# Patient Record
Sex: Male | Born: 1965
Health system: Southern US, Community
[De-identification: ages and names within clinical notes are randomized; demographics above are authoritative.]

---

## 2006-01-04 ENCOUNTER — Encounter: Admission: RE | Admit: 2006-01-04 | Discharge: 2006-01-04 | Payer: Self-pay | Admitting: Internal Medicine

## 2006-01-04 ENCOUNTER — Encounter (INDEPENDENT_AMBULATORY_CARE_PROVIDER_SITE_OTHER): Payer: Self-pay | Admitting: *Deleted

## 2006-01-04 ENCOUNTER — Ambulatory Visit: Payer: Self-pay | Admitting: Internal Medicine

## 2006-01-04 LAB — CONVERTED CEMR LAB
CD4 Count: 300 microliters
CD4 T Cell Abs: 300
HIV 1 RNA Quant: 47000 copies/mL

## 2006-01-24 ENCOUNTER — Ambulatory Visit: Payer: Self-pay | Admitting: Internal Medicine

## 2006-02-02 DIAGNOSIS — B20 Human immunodeficiency virus [HIV] disease: Secondary | ICD-10-CM

## 2006-02-02 DIAGNOSIS — B370 Candidal stomatitis: Secondary | ICD-10-CM | POA: Insufficient documentation

## 2006-02-23 ENCOUNTER — Ambulatory Visit: Payer: Self-pay | Admitting: Internal Medicine

## 2006-02-23 ENCOUNTER — Encounter: Admission: RE | Admit: 2006-02-23 | Discharge: 2006-02-23 | Payer: Self-pay | Admitting: Internal Medicine

## 2006-02-23 ENCOUNTER — Encounter (INDEPENDENT_AMBULATORY_CARE_PROVIDER_SITE_OTHER): Payer: Self-pay | Admitting: *Deleted

## 2006-02-23 LAB — CONVERTED CEMR LAB
CD4 Count: 550 microliters
HIV 1 RNA Quant: 864 copies/mL

## 2006-03-09 ENCOUNTER — Ambulatory Visit: Payer: Self-pay | Admitting: Internal Medicine

## 2006-04-11 ENCOUNTER — Ambulatory Visit: Payer: Self-pay | Admitting: Internal Medicine

## 2006-05-04 DIAGNOSIS — B009 Herpesviral infection, unspecified: Secondary | ICD-10-CM | POA: Insufficient documentation

## 2006-05-04 DIAGNOSIS — F329 Major depressive disorder, single episode, unspecified: Secondary | ICD-10-CM

## 2006-05-14 ENCOUNTER — Encounter (INDEPENDENT_AMBULATORY_CARE_PROVIDER_SITE_OTHER): Payer: Self-pay | Admitting: *Deleted

## 2006-05-14 LAB — CONVERTED CEMR LAB

## 2006-05-16 ENCOUNTER — Encounter: Payer: Self-pay | Admitting: Internal Medicine

## 2006-05-27 ENCOUNTER — Encounter (INDEPENDENT_AMBULATORY_CARE_PROVIDER_SITE_OTHER): Payer: Self-pay | Admitting: *Deleted

## 2006-06-04 ENCOUNTER — Ambulatory Visit: Payer: Self-pay | Admitting: Internal Medicine

## 2006-06-04 ENCOUNTER — Encounter: Admission: RE | Admit: 2006-06-04 | Discharge: 2006-06-04 | Payer: Self-pay | Admitting: Family Medicine

## 2006-06-04 LAB — CONVERTED CEMR LAB
ALT: 11 units/L (ref 0–53)
AST: 10 units/L (ref 0–37)
Albumin: 4 g/dL (ref 3.5–5.2)
Alkaline Phosphatase: 94 units/L (ref 39–117)
BUN: 19 mg/dL (ref 6–23)
Basophils Absolute: 0.1 10*3/uL (ref 0.0–0.1)
Basophils Relative: 2 % — ABNORMAL HIGH (ref 0–1)
CD4 Count: 630 microliters
CO2: 23 meq/L (ref 19–32)
Calcium: 9 mg/dL (ref 8.4–10.5)
Chloride: 106 meq/L (ref 96–112)
Creatinine, Ser: 0.91 mg/dL (ref 0.40–1.50)
Eosinophils Absolute: 0.3 10*3/uL (ref 0.0–0.7)
Eosinophils Relative: 4 % (ref 0–5)
Glucose, Bld: 89 mg/dL (ref 70–99)
HCT: 46 % (ref 39.0–52.0)
HIV 1 RNA Quant: <50 copies/mL (ref <50)
HIV-1 RNA Quant, Log: <1.7 (ref <1.70)
Hemoglobin: 15.4 g/dL (ref 13.0–17.0)
Lymphocytes Relative: 30 % (ref 12–46)
Lymphs Abs: 2.3 10*3/uL (ref 0.7–3.3)
MCHC: 33.5 g/dL (ref 30.0–36.0)
MCV: 92.9 fL (ref 78.0–100.0)
Monocytes Absolute: 0.6 10*3/uL (ref 0.2–0.7)
Monocytes Relative: 8 % (ref 3–11)
Neutro Abs: 4.3 10*3/uL (ref 1.7–7.7)
Neutrophils Relative %: 57 % (ref 43–77)
Platelets: 298 10*3/uL (ref 150–400)
Potassium: 4.3 meq/L (ref 3.5–5.3)
RBC: 4.95 M/uL (ref 4.22–5.81)
RDW: 14.1 % — ABNORMAL HIGH (ref 11.5–14.0)
Sodium: 140 meq/L (ref 135–145)
Total Bilirubin: 0.2 mg/dL — ABNORMAL LOW (ref 0.3–1.2)
Total Protein: 7.6 g/dL (ref 6.0–8.3)
WBC: 7.5 10*3/uL (ref 4.0–10.5)

## 2006-06-19 ENCOUNTER — Ambulatory Visit: Payer: Self-pay | Admitting: Internal Medicine

## 2006-06-19 DIAGNOSIS — M25569 Pain in unspecified knee: Secondary | ICD-10-CM

## 2006-07-05 ENCOUNTER — Telehealth: Payer: Self-pay | Admitting: Internal Medicine

## 2006-08-15 ENCOUNTER — Ambulatory Visit: Payer: Self-pay | Admitting: Internal Medicine

## 2006-08-15 ENCOUNTER — Encounter: Admission: RE | Admit: 2006-08-15 | Discharge: 2006-08-15 | Payer: Self-pay | Admitting: Internal Medicine

## 2006-08-15 LAB — CONVERTED CEMR LAB
ALT: 13 units/L (ref 0–53)
AST: 13 units/L (ref 0–37)
Albumin: 4.3 g/dL (ref 3.5–5.2)
Alkaline Phosphatase: 106 units/L (ref 39–117)
BUN: 20 mg/dL (ref 6–23)
Basophils Absolute: 0.1 10*3/uL (ref 0.0–0.1)
Basophils Relative: 1 % (ref 0–1)
CD4 Count: 830 microliters
CO2: 23 meq/L (ref 19–32)
Calcium: 8.7 mg/dL (ref 8.4–10.5)
Chloride: 105 meq/L (ref 96–112)
Creatinine, Ser: 1.06 mg/dL (ref 0.40–1.50)
Eosinophils Absolute: 0.3 10*3/uL (ref 0.0–0.7)
Eosinophils Relative: 3 % (ref 0–5)
Glucose, Bld: 86 mg/dL (ref 70–99)
HCT: 46.7 % (ref 39.0–52.0)
HIV 1 RNA Quant: 303 copies/mL — ABNORMAL HIGH (ref <50)
HIV-1 RNA Quant, Log: 2.48 — ABNORMAL HIGH (ref <1.70)
Hemoglobin: 15.5 g/dL (ref 13.0–17.0)
Lymphocytes Relative: 33 % (ref 12–46)
Lymphs Abs: 3 10*3/uL (ref 0.7–3.3)
MCHC: 33.2 g/dL (ref 30.0–36.0)
MCV: 91.4 fL (ref 78.0–100.0)
Monocytes Absolute: 0.8 10*3/uL — ABNORMAL HIGH (ref 0.2–0.7)
Monocytes Relative: 9 % (ref 3–11)
Neutro Abs: 4.9 10*3/uL (ref 1.7–7.7)
Neutrophils Relative %: 54 % (ref 43–77)
Platelets: 310 10*3/uL (ref 150–400)
Potassium: 4.7 meq/L (ref 3.5–5.3)
RBC: 5.11 M/uL (ref 4.22–5.81)
RDW: 14 % (ref 11.5–14.0)
Sodium: 138 meq/L (ref 135–145)
Total Bilirubin: 0.2 mg/dL — ABNORMAL LOW (ref 0.3–1.2)
Total Protein: 7.7 g/dL (ref 6.0–8.3)
WBC: 9.1 10*3/uL (ref 4.0–10.5)

## 2006-08-29 ENCOUNTER — Ambulatory Visit: Payer: Self-pay | Admitting: Internal Medicine

## 2006-09-06 ENCOUNTER — Encounter: Payer: Self-pay | Admitting: Internal Medicine

## 2006-10-04 ENCOUNTER — Telehealth: Payer: Self-pay | Admitting: Internal Medicine

## 2006-10-12 ENCOUNTER — Ambulatory Visit: Payer: Self-pay | Admitting: Internal Medicine

## 2006-10-12 ENCOUNTER — Encounter: Admission: RE | Admit: 2006-10-12 | Discharge: 2006-10-12 | Payer: Self-pay | Admitting: Internal Medicine

## 2006-10-12 LAB — CONVERTED CEMR LAB
ALT: 13 units/L (ref 0–53)
AST: 13 units/L (ref 0–37)
Albumin: 3.8 g/dL (ref 3.5–5.2)
Alkaline Phosphatase: 105 units/L (ref 39–117)
BUN: 14 mg/dL (ref 6–23)
Basophils Absolute: 0.1 10*3/uL (ref 0.0–0.1)
Basophils Relative: 1 % (ref 0–1)
CO2: 26 meq/L (ref 19–32)
Calcium: 9.5 mg/dL (ref 8.4–10.5)
Chloride: 105 meq/L (ref 96–112)
Creatinine, Ser: 1.04 mg/dL (ref 0.40–1.50)
Eosinophils Absolute: 0.3 10*3/uL (ref 0.0–0.7)
Eosinophils Relative: 3 % (ref 0–5)
Glucose, Bld: 99 mg/dL (ref 70–99)
HCT: 49.7 % (ref 39.0–52.0)
HIV 1 RNA Quant: 131 copies/mL — ABNORMAL HIGH (ref <50)
HIV-1 RNA Quant, Log: 2.12 — ABNORMAL HIGH (ref <1.70)
Hemoglobin: 16.1 g/dL (ref 13.0–17.0)
Lymphocytes Relative: 25 % (ref 12–46)
Lymphs Abs: 2 10*3/uL (ref 0.7–3.3)
MCHC: 32.4 g/dL (ref 30.0–36.0)
MCV: 95.2 fL (ref 78.0–100.0)
Monocytes Absolute: 0.6 10*3/uL (ref 0.2–0.7)
Monocytes Relative: 7 % (ref 3–11)
Neutro Abs: 5.1 10*3/uL (ref 1.7–7.7)
Neutrophils Relative %: 63 % (ref 43–77)
Platelets: 313 10*3/uL (ref 150–400)
Potassium: 4.5 meq/L (ref 3.5–5.3)
RBC: 5.22 M/uL (ref 4.22–5.81)
RDW: 14 % (ref 11.5–14.0)
Sodium: 142 meq/L (ref 135–145)
Total Bilirubin: 0.3 mg/dL (ref 0.3–1.2)
Total Protein: 7.5 g/dL (ref 6.0–8.3)
WBC: 8.1 10*3/uL (ref 4.0–10.5)

## 2006-10-26 ENCOUNTER — Ambulatory Visit: Payer: Self-pay | Admitting: Internal Medicine

## 2007-01-01 ENCOUNTER — Telehealth: Payer: Self-pay | Admitting: Internal Medicine

## 2007-01-28 ENCOUNTER — Encounter (INDEPENDENT_AMBULATORY_CARE_PROVIDER_SITE_OTHER): Payer: Self-pay | Admitting: *Deleted

## 2007-01-28 ENCOUNTER — Telehealth: Payer: Self-pay | Admitting: Internal Medicine

## 2007-01-28 ENCOUNTER — Ambulatory Visit: Payer: Self-pay | Admitting: Internal Medicine

## 2007-01-28 ENCOUNTER — Encounter: Admission: RE | Admit: 2007-01-28 | Discharge: 2007-01-28 | Payer: Self-pay | Admitting: Internal Medicine

## 2007-01-28 LAB — CONVERTED CEMR LAB
ALT: 15 units/L (ref 0–53)
AST: 15 units/L (ref 0–37)
Albumin: 4 g/dL (ref 3.5–5.2)
Alkaline Phosphatase: 106 units/L (ref 39–117)
BUN: 19 mg/dL (ref 6–23)
Basophils Absolute: 0.1 10*3/uL (ref 0.0–0.1)
Basophils Relative: 1 % (ref 0–1)
CO2: 21 meq/L (ref 19–32)
Calcium: 9.2 mg/dL (ref 8.4–10.5)
Chloride: 109 meq/L (ref 96–112)
Creatinine, Ser: 1.02 mg/dL (ref 0.40–1.50)
Eosinophils Absolute: 0.3 10*3/uL (ref 0.2–0.7)
Eosinophils Relative: 3 % (ref 0–5)
Glucose, Bld: 93 mg/dL (ref 70–99)
HCT: 49.1 % (ref 39.0–52.0)
HIV 1 RNA Quant: <50 copies/mL (ref <50)
HIV-1 RNA Quant, Log: <1.7 (ref <1.70)
Hemoglobin: 15.8 g/dL (ref 13.0–17.0)
Lymphocytes Relative: 33 % (ref 12–46)
Lymphs Abs: 2.6 10*3/uL (ref 0.7–4.0)
MCHC: 32.2 g/dL (ref 30.0–36.0)
MCV: 92.3 fL (ref 78.0–100.0)
Monocytes Absolute: 0.8 10*3/uL (ref 0.1–1.0)
Monocytes Relative: 11 % (ref 3–12)
Neutro Abs: 4.1 10*3/uL (ref 1.7–7.7)
Neutrophils Relative %: 52 % (ref 43–77)
Platelets: 318 10*3/uL (ref 150–400)
Potassium: 5.2 meq/L (ref 3.5–5.3)
RBC: 5.32 M/uL (ref 4.22–5.81)
RDW: 14.3 % (ref 11.5–15.5)
Sodium: 138 meq/L (ref 135–145)
Total Bilirubin: 0.2 mg/dL — ABNORMAL LOW (ref 0.3–1.2)
Total Protein: 7.4 g/dL (ref 6.0–8.3)
WBC: 7.9 10*3/uL (ref 4.0–10.5)

## 2007-02-13 ENCOUNTER — Ambulatory Visit: Payer: Self-pay | Admitting: Internal Medicine

## 2007-02-13 DIAGNOSIS — L0233 Carbuncle of buttock: Secondary | ICD-10-CM

## 2007-02-21 ENCOUNTER — Encounter: Payer: Self-pay | Admitting: Internal Medicine

## 2007-03-19 ENCOUNTER — Encounter: Payer: Self-pay | Admitting: Internal Medicine

## 2007-03-20 ENCOUNTER — Encounter (INDEPENDENT_AMBULATORY_CARE_PROVIDER_SITE_OTHER): Payer: Self-pay | Admitting: *Deleted

## 2007-04-01 ENCOUNTER — Telehealth (INDEPENDENT_AMBULATORY_CARE_PROVIDER_SITE_OTHER): Payer: Self-pay | Admitting: *Deleted

## 2007-04-11 ENCOUNTER — Telehealth: Payer: Self-pay | Admitting: Internal Medicine

## 2007-05-13 ENCOUNTER — Encounter: Admission: RE | Admit: 2007-05-13 | Discharge: 2007-05-13 | Payer: Self-pay | Admitting: Internal Medicine

## 2007-05-13 ENCOUNTER — Ambulatory Visit: Payer: Self-pay | Admitting: Internal Medicine

## 2007-05-13 LAB — CONVERTED CEMR LAB
ALT: 17 units/L (ref 0–53)
AST: 14 units/L (ref 0–37)
Albumin: 4.5 g/dL (ref 3.5–5.2)
Alkaline Phosphatase: 91 units/L (ref 39–117)
BUN: 21 mg/dL (ref 6–23)
Basophils Absolute: 0.1 10*3/uL (ref 0.0–0.1)
Basophils Relative: 2 % — ABNORMAL HIGH (ref 0–1)
CO2: 22 meq/L (ref 19–32)
Calcium: 9.8 mg/dL (ref 8.4–10.5)
Chloride: 105 meq/L (ref 96–112)
Creatinine, Ser: 0.92 mg/dL (ref 0.40–1.50)
Eosinophils Absolute: 0.2 10*3/uL (ref 0.0–0.7)
Eosinophils Relative: 3 % (ref 0–5)
Glucose, Bld: 103 mg/dL — ABNORMAL HIGH (ref 70–99)
HCT: 50.7 % (ref 39.0–52.0)
HIV 1 RNA Quant: 148 copies/mL — ABNORMAL HIGH (ref <50)
HIV-1 RNA Quant, Log: 2.17 — ABNORMAL HIGH (ref <1.70)
Hemoglobin: 16.3 g/dL (ref 13.0–17.0)
Lymphocytes Relative: 37 % (ref 12–46)
Lymphs Abs: 2.7 10*3/uL (ref 0.7–4.0)
MCHC: 32.1 g/dL (ref 30.0–36.0)
MCV: 92.7 fL (ref 78.0–100.0)
Monocytes Absolute: 0.7 10*3/uL (ref 0.1–1.0)
Monocytes Relative: 9 % (ref 3–12)
Neutro Abs: 3.7 10*3/uL (ref 1.7–7.7)
Neutrophils Relative %: 50 % (ref 43–77)
Platelets: 309 10*3/uL (ref 150–400)
Potassium: 5.1 meq/L (ref 3.5–5.3)
RBC: 5.47 M/uL (ref 4.22–5.81)
RDW: 14 % (ref 11.5–15.5)
Sodium: 139 meq/L (ref 135–145)
Total Bilirubin: 0.2 mg/dL — ABNORMAL LOW (ref 0.3–1.2)
Total Protein: 8.3 g/dL (ref 6.0–8.3)
WBC: 7.3 10*3/uL (ref 4.0–10.5)

## 2007-05-31 ENCOUNTER — Emergency Department (HOSPITAL_COMMUNITY): Admission: EM | Admit: 2007-05-31 | Discharge: 2007-05-31 | Payer: Self-pay | Admitting: Emergency Medicine

## 2007-06-05 ENCOUNTER — Ambulatory Visit: Payer: Self-pay | Admitting: Internal Medicine

## 2007-06-05 DIAGNOSIS — R030 Elevated blood-pressure reading, without diagnosis of hypertension: Secondary | ICD-10-CM | POA: Insufficient documentation

## 2007-06-05 DIAGNOSIS — G609 Hereditary and idiopathic neuropathy, unspecified: Secondary | ICD-10-CM | POA: Insufficient documentation

## 2007-07-02 ENCOUNTER — Telehealth: Payer: Self-pay | Admitting: Internal Medicine

## 2007-08-07 ENCOUNTER — Telehealth: Payer: Self-pay | Admitting: Internal Medicine

## 2007-09-02 ENCOUNTER — Ambulatory Visit: Payer: Self-pay | Admitting: Internal Medicine

## 2007-09-02 ENCOUNTER — Encounter: Admission: RE | Admit: 2007-09-02 | Discharge: 2007-09-02 | Payer: Self-pay | Admitting: Internal Medicine

## 2007-09-02 LAB — CONVERTED CEMR LAB
ALT: 20 units/L (ref 0–53)
AST: 15 units/L (ref 0–37)
Albumin: 4.2 g/dL (ref 3.5–5.2)
Alkaline Phosphatase: 87 units/L (ref 39–117)
BUN: 15 mg/dL (ref 6–23)
Basophils Absolute: 0.1 10*3/uL (ref 0.0–0.1)
Basophils Relative: 2 % — ABNORMAL HIGH (ref 0–1)
CO2: 22 meq/L (ref 19–32)
Calcium: 9.4 mg/dL (ref 8.4–10.5)
Chloride: 108 meq/L (ref 96–112)
Creatinine, Ser: 0.67 mg/dL (ref 0.40–1.50)
Eosinophils Absolute: 0.3 10*3/uL (ref 0.0–0.7)
Eosinophils Relative: 4 % (ref 0–5)
Glucose, Bld: 100 mg/dL — ABNORMAL HIGH (ref 70–99)
HCT: 48.8 % (ref 39.0–52.0)
HIV 1 RNA Quant: 336 copies/mL — ABNORMAL HIGH (ref <50)
HIV-1 RNA Quant, Log: 2.53 — ABNORMAL HIGH (ref <1.70)
Hemoglobin: 15.7 g/dL (ref 13.0–17.0)
Lymphocytes Relative: 32 % (ref 12–46)
Lymphs Abs: 2.6 10*3/uL (ref 0.7–4.0)
MCHC: 32.2 g/dL (ref 30.0–36.0)
MCV: 94 fL (ref 78.0–100.0)
Monocytes Absolute: 0.7 10*3/uL (ref 0.1–1.0)
Monocytes Relative: 8 % (ref 3–12)
Neutro Abs: 4.5 10*3/uL (ref 1.7–7.7)
Neutrophils Relative %: 55 % (ref 43–77)
Platelets: 253 10*3/uL (ref 150–400)
Potassium: 5.4 meq/L — ABNORMAL HIGH (ref 3.5–5.3)
RBC: 5.19 M/uL (ref 4.22–5.81)
RDW: 13.7 % (ref 11.5–15.5)
Sodium: 140 meq/L (ref 135–145)
Total Bilirubin: 0.2 mg/dL — ABNORMAL LOW (ref 0.3–1.2)
Total Protein: 7.5 g/dL (ref 6.0–8.3)
WBC: 8.2 10*3/uL (ref 4.0–10.5)

## 2007-09-17 ENCOUNTER — Ambulatory Visit: Payer: Self-pay | Admitting: Internal Medicine

## 2007-09-17 DIAGNOSIS — L738 Other specified follicular disorders: Secondary | ICD-10-CM

## 2007-09-17 LAB — CONVERTED CEMR LAB
BUN: 18 mg/dL (ref 6–23)
CO2: 22 meq/L (ref 19–32)
Calcium: 9 mg/dL (ref 8.4–10.5)
Chloride: 108 meq/L (ref 96–112)
Creatinine, Ser: 0.84 mg/dL (ref 0.40–1.50)
Glucose, Bld: 94 mg/dL (ref 70–99)
Potassium: 4.6 meq/L (ref 3.5–5.3)
Sodium: 140 meq/L (ref 135–145)

## 2007-10-02 ENCOUNTER — Encounter (INDEPENDENT_AMBULATORY_CARE_PROVIDER_SITE_OTHER): Payer: Self-pay | Admitting: *Deleted

## 2007-11-22 ENCOUNTER — Telehealth: Payer: Self-pay | Admitting: Internal Medicine

## 2007-12-16 ENCOUNTER — Ambulatory Visit: Payer: Self-pay | Admitting: Internal Medicine

## 2007-12-16 LAB — CONVERTED CEMR LAB
ALT: 21 units/L (ref 0–53)
AST: 16 units/L (ref 0–37)
Albumin: 4.3 g/dL (ref 3.5–5.2)
Alkaline Phosphatase: 83 units/L (ref 39–117)
BUN: 18 mg/dL (ref 6–23)
Basophils Absolute: 0.2 10*3/uL — ABNORMAL HIGH (ref 0.0–0.1)
Basophils Relative: 2 % — ABNORMAL HIGH (ref 0–1)
CO2: 22 meq/L (ref 19–32)
Calcium: 9.4 mg/dL (ref 8.4–10.5)
Chloride: 105 meq/L (ref 96–112)
Cholesterol: 282 mg/dL — ABNORMAL HIGH (ref 0–200)
Creatinine, Ser: 1.04 mg/dL (ref 0.40–1.50)
Eosinophils Absolute: 0.3 10*3/uL (ref 0.0–0.7)
Eosinophils Relative: 3 % (ref 0–5)
Glucose, Bld: 96 mg/dL (ref 70–99)
HCT: 47.9 % (ref 39.0–52.0)
HDL: 35 mg/dL — ABNORMAL LOW (ref >39)
HIV 1 RNA Quant: 65 copies/mL — ABNORMAL HIGH (ref <50)
HIV-1 RNA Quant, Log: 1.81 — ABNORMAL HIGH (ref <1.70)
Hemoglobin: 15.9 g/dL (ref 13.0–17.0)
Lymphocytes Relative: 37 % (ref 12–46)
Lymphs Abs: 3.7 10*3/uL (ref 0.7–4.0)
MCHC: 33.2 g/dL (ref 30.0–36.0)
MCV: 91.8 fL (ref 78.0–100.0)
Monocytes Absolute: 0.9 10*3/uL (ref 0.1–1.0)
Monocytes Relative: 9 % (ref 3–12)
Neutro Abs: 4.9 10*3/uL (ref 1.7–7.7)
Neutrophils Relative %: 50 % (ref 43–77)
Platelets: 310 10*3/uL (ref 150–400)
Potassium: 4.5 meq/L (ref 3.5–5.3)
RBC: 5.22 M/uL (ref 4.22–5.81)
RDW: 13.5 % (ref 11.5–15.5)
Sodium: 140 meq/L (ref 135–145)
Total Bilirubin: 0.2 mg/dL — ABNORMAL LOW (ref 0.3–1.2)
Total CHOL/HDL Ratio: 8.1
Total Protein: 7.6 g/dL (ref 6.0–8.3)
Triglycerides: 489 mg/dL — ABNORMAL HIGH (ref <150)
WBC: 10 10*3/uL (ref 4.0–10.5)

## 2007-12-31 ENCOUNTER — Ambulatory Visit: Payer: Self-pay | Admitting: Internal Medicine

## 2007-12-31 DIAGNOSIS — E785 Hyperlipidemia, unspecified: Secondary | ICD-10-CM | POA: Insufficient documentation

## 2008-02-03 ENCOUNTER — Telehealth: Payer: Self-pay | Admitting: Internal Medicine

## 2008-03-30 ENCOUNTER — Encounter (INDEPENDENT_AMBULATORY_CARE_PROVIDER_SITE_OTHER): Payer: Self-pay | Admitting: *Deleted

## 2008-03-30 ENCOUNTER — Ambulatory Visit: Payer: Self-pay | Admitting: Internal Medicine

## 2008-03-30 LAB — CONVERTED CEMR LAB
ALT: 19 units/L (ref 0–53)
AST: 14 units/L (ref 0–37)
Albumin: 4.4 g/dL (ref 3.5–5.2)
Alkaline Phosphatase: 81 units/L (ref 39–117)
BUN: 14 mg/dL (ref 6–23)
Basophils Absolute: 0.1 10*3/uL (ref 0.0–0.1)
Basophils Relative: 1 % (ref 0–1)
CO2: 21 meq/L (ref 19–32)
Calcium: 10 mg/dL (ref 8.4–10.5)
Chloride: 103 meq/L (ref 96–112)
Cholesterol: 282 mg/dL — ABNORMAL HIGH (ref 0–200)
Creatinine, Ser: 0.8 mg/dL (ref 0.40–1.50)
Eosinophils Absolute: 0.2 10*3/uL (ref 0.0–0.7)
Eosinophils Relative: 2 % (ref 0–5)
Glucose, Bld: 89 mg/dL (ref 70–99)
HCT: 48.9 % (ref 39.0–52.0)
HDL: 34 mg/dL — ABNORMAL LOW (ref >39)
HIV 1 RNA Quant: 296 copies/mL — ABNORMAL HIGH (ref <48)
HIV-1 RNA Quant, Log: 2.47 — ABNORMAL HIGH (ref <1.68)
Hemoglobin: 16.1 g/dL (ref 13.0–17.0)
LDL Cholesterol: 173 mg/dL — ABNORMAL HIGH (ref 0–99)
Lymphocytes Relative: 37 % (ref 12–46)
Lymphs Abs: 3.6 10*3/uL (ref 0.7–4.0)
MCHC: 32.9 g/dL (ref 30.0–36.0)
MCV: 89.2 fL (ref 78.0–100.0)
Monocytes Absolute: 0.8 10*3/uL (ref 0.1–1.0)
Monocytes Relative: 8 % (ref 3–12)
Neutro Abs: 5.1 10*3/uL (ref 1.7–7.7)
Neutrophils Relative %: 52 % (ref 43–77)
Platelets: 301 10*3/uL (ref 150–400)
Potassium: 4.8 meq/L (ref 3.5–5.3)
RBC: 5.48 M/uL (ref 4.22–5.81)
RDW: 13.4 % (ref 11.5–15.5)
Sodium: 138 meq/L (ref 135–145)
Total Bilirubin: 0.3 mg/dL (ref 0.3–1.2)
Total CHOL/HDL Ratio: 8.3
Total Protein: 7.6 g/dL (ref 6.0–8.3)
Triglycerides: 375 mg/dL — ABNORMAL HIGH (ref <150)
VLDL: 75 mg/dL — ABNORMAL HIGH (ref 0–40)
WBC: 9.8 10*3/uL (ref 4.0–10.5)

## 2008-04-22 ENCOUNTER — Encounter (INDEPENDENT_AMBULATORY_CARE_PROVIDER_SITE_OTHER): Payer: Self-pay | Admitting: *Deleted

## 2008-04-22 ENCOUNTER — Ambulatory Visit: Payer: Self-pay | Admitting: Internal Medicine

## 2008-05-21 ENCOUNTER — Telehealth: Payer: Self-pay | Admitting: Internal Medicine

## 2008-06-08 ENCOUNTER — Encounter: Payer: Self-pay | Admitting: Internal Medicine

## 2008-06-23 ENCOUNTER — Telehealth (INDEPENDENT_AMBULATORY_CARE_PROVIDER_SITE_OTHER): Payer: Self-pay | Admitting: *Deleted

## 2008-07-21 ENCOUNTER — Ambulatory Visit: Payer: Self-pay | Admitting: Internal Medicine

## 2008-07-21 ENCOUNTER — Telehealth (INDEPENDENT_AMBULATORY_CARE_PROVIDER_SITE_OTHER): Payer: Self-pay | Admitting: *Deleted

## 2008-07-21 LAB — CONVERTED CEMR LAB
ALT: 27 units/L (ref 0–53)
AST: 20 units/L (ref 0–37)
Albumin: 4.3 g/dL (ref 3.5–5.2)
Alkaline Phosphatase: 89 units/L (ref 39–117)
BUN: 17 mg/dL (ref 6–23)
Basophils Absolute: 0.2 10*3/uL — ABNORMAL HIGH (ref 0.0–0.1)
Basophils Relative: 2 % — ABNORMAL HIGH (ref 0–1)
CO2: 19 meq/L (ref 19–32)
Calcium: 9.2 mg/dL (ref 8.4–10.5)
Chloride: 107 meq/L (ref 96–112)
Cholesterol: 263 mg/dL — ABNORMAL HIGH (ref 0–200)
Creatinine, Ser: 0.86 mg/dL (ref 0.40–1.50)
Eosinophils Absolute: 0.3 10*3/uL (ref 0.0–0.7)
Eosinophils Relative: 3 % (ref 0–5)
GFR calc Af Amer: >60 mL/min (ref >60)
GFR calc non Af Amer: >60 mL/min (ref >60)
Glucose, Bld: 99 mg/dL (ref 70–99)
HCT: 47.3 % (ref 39.0–52.0)
HDL: 38 mg/dL — ABNORMAL LOW (ref >39)
HIV 1 RNA Quant: 98 copies/mL — ABNORMAL HIGH (ref <48)
HIV-1 RNA Quant, Log: 1.99 — ABNORMAL HIGH (ref <1.68)
Hemoglobin: 15.9 g/dL (ref 13.0–17.0)
LDL Cholesterol: 153 mg/dL — ABNORMAL HIGH (ref 0–99)
Lymphocytes Relative: 35 % (ref 12–46)
Lymphs Abs: 3.2 10*3/uL (ref 0.7–4.0)
MCHC: 33.6 g/dL (ref 30.0–36.0)
MCV: 92.2 fL (ref 78.0–100.0)
Monocytes Absolute: 0.8 10*3/uL (ref 0.1–1.0)
Monocytes Relative: 9 % (ref 3–12)
Neutro Abs: 4.6 10*3/uL (ref 1.7–7.7)
Neutrophils Relative %: 51 % (ref 43–77)
Platelets: 256 10*3/uL (ref 150–400)
Potassium: 5.1 meq/L (ref 3.5–5.3)
RBC: 5.13 M/uL (ref 4.22–5.81)
RDW: 14 % (ref 11.5–15.5)
Sodium: 138 meq/L (ref 135–145)
Total Bilirubin: 0.2 mg/dL — ABNORMAL LOW (ref 0.3–1.2)
Total CHOL/HDL Ratio: 6.9
Total Protein: 7.6 g/dL (ref 6.0–8.3)
Triglycerides: 360 mg/dL — ABNORMAL HIGH (ref <150)
VLDL: 72 mg/dL — ABNORMAL HIGH (ref 0–40)
WBC: 9 10*3/uL (ref 4.0–10.5)

## 2008-08-05 ENCOUNTER — Ambulatory Visit: Payer: Self-pay | Admitting: Internal Medicine

## 2008-09-18 ENCOUNTER — Telehealth (INDEPENDENT_AMBULATORY_CARE_PROVIDER_SITE_OTHER): Payer: Self-pay | Admitting: *Deleted

## 2008-10-20 ENCOUNTER — Telehealth (INDEPENDENT_AMBULATORY_CARE_PROVIDER_SITE_OTHER): Payer: Self-pay | Admitting: *Deleted

## 2008-11-03 ENCOUNTER — Ambulatory Visit: Payer: Self-pay | Admitting: Internal Medicine

## 2008-11-03 LAB — CONVERTED CEMR LAB
ALT: 23 units/L (ref 0–53)
AST: 18 units/L (ref 0–37)
Albumin: 4.3 g/dL (ref 3.5–5.2)
Alkaline Phosphatase: 72 units/L (ref 39–117)
BUN: 13 mg/dL (ref 6–23)
Basophils Absolute: 0.1 10*3/uL (ref 0.0–0.1)
Basophils Relative: 2 % — ABNORMAL HIGH (ref 0–1)
CO2: 21 meq/L (ref 19–32)
Calcium: 9.5 mg/dL (ref 8.4–10.5)
Chloride: 104 meq/L (ref 96–112)
Cholesterol: 241 mg/dL — ABNORMAL HIGH (ref 0–200)
Creatinine, Ser: 0.81 mg/dL (ref 0.40–1.50)
Eosinophils Absolute: 0.2 10*3/uL (ref 0.0–0.7)
Eosinophils Relative: 2 % (ref 0–5)
Glucose, Bld: 96 mg/dL (ref 70–99)
HCT: 46.4 % (ref 39.0–52.0)
HDL: 33 mg/dL — ABNORMAL LOW (ref >39)
HIV 1 RNA Quant: 196 copies/mL — ABNORMAL HIGH (ref <48)
HIV-1 RNA Quant, Log: 2.29 — ABNORMAL HIGH (ref <1.68)
Hemoglobin: 15 g/dL (ref 13.0–17.0)
LDL Cholesterol: 143 mg/dL — ABNORMAL HIGH (ref 0–99)
Lymphocytes Relative: 38 % (ref 12–46)
Lymphs Abs: 3.6 10*3/uL (ref 0.7–4.0)
MCHC: 32.3 g/dL (ref 30.0–36.0)
MCV: 93 fL (ref >=78.0)
Monocytes Absolute: 0.9 10*3/uL (ref 0.1–1.0)
Monocytes Relative: 9 % (ref 3–12)
Neutro Abs: 4.7 10*3/uL (ref 1.7–7.7)
Neutrophils Relative %: 50 % (ref 43–77)
Platelets: 281 10*3/uL (ref 150–400)
Potassium: 4.9 meq/L (ref 3.5–5.3)
RBC: 4.99 M/uL (ref 4.22–5.81)
RDW: 13.7 % (ref 11.5–15.5)
Sodium: 138 meq/L (ref 135–145)
Total Bilirubin: 0.2 mg/dL — ABNORMAL LOW (ref 0.3–1.2)
Total CHOL/HDL Ratio: 7.3
Total Protein: 7.3 g/dL (ref 6.0–8.3)
Triglycerides: 327 mg/dL — ABNORMAL HIGH (ref <150)
VLDL: 65 mg/dL — ABNORMAL HIGH (ref 0–40)
WBC: 9.5 10*3/uL (ref 4.0–10.5)

## 2008-11-18 ENCOUNTER — Ambulatory Visit: Payer: Self-pay | Admitting: Internal Medicine

## 2008-12-22 ENCOUNTER — Telehealth (INDEPENDENT_AMBULATORY_CARE_PROVIDER_SITE_OTHER): Payer: Self-pay | Admitting: *Deleted

## 2009-02-19 ENCOUNTER — Ambulatory Visit: Payer: Self-pay | Admitting: Internal Medicine

## 2009-02-19 ENCOUNTER — Telehealth (INDEPENDENT_AMBULATORY_CARE_PROVIDER_SITE_OTHER): Payer: Self-pay | Admitting: *Deleted

## 2009-02-19 LAB — CONVERTED CEMR LAB
ALT: 24 units/L (ref 0–53)
AST: 18 units/L (ref 0–37)
Albumin: 4.4 g/dL (ref 3.5–5.2)
Alkaline Phosphatase: 63 units/L (ref 39–117)
BUN: 16 mg/dL (ref 6–23)
Basophils Absolute: 0.1 10*3/uL (ref 0.0–0.1)
Basophils Relative: 1 % (ref 0–1)
CO2: 20 meq/L (ref 19–32)
Calcium: 9.3 mg/dL (ref 8.4–10.5)
Chloride: 105 meq/L (ref 96–112)
Cholesterol: 237 mg/dL — ABNORMAL HIGH (ref 0–200)
Creatinine, Ser: 0.89 mg/dL (ref 0.40–1.50)
Eosinophils Absolute: 0.3 10*3/uL (ref 0.0–0.7)
Eosinophils Relative: 3 % (ref 0–5)
Glucose, Bld: 101 mg/dL — ABNORMAL HIGH (ref 70–99)
HCT: 44.9 % (ref 39.0–52.0)
HDL: 35 mg/dL — ABNORMAL LOW (ref >39)
HIV 1 RNA Quant: <48 copies/mL (ref <48)
HIV-1 RNA Quant, Log: <1.68 (ref <1.68)
Hemoglobin: 14.9 g/dL (ref 13.0–17.0)
LDL Cholesterol: 156 mg/dL — ABNORMAL HIGH (ref 0–99)
Lymphocytes Relative: 41 % (ref 12–46)
Lymphs Abs: 3.8 10*3/uL (ref 0.7–4.0)
MCHC: 33.2 g/dL (ref 30.0–36.0)
MCV: 89.8 fL (ref >=78.0)
Monocytes Absolute: 0.8 10*3/uL (ref 0.1–1.0)
Monocytes Relative: 8 % (ref 3–12)
Neutro Abs: 4.4 10*3/uL (ref 1.7–7.7)
Neutrophils Relative %: 47 % (ref 43–77)
Platelets: 325 10*3/uL (ref 150–400)
Potassium: 5.2 meq/L (ref 3.5–5.3)
RBC: 5 M/uL (ref 4.22–5.81)
RDW: 13.5 % (ref 11.5–15.5)
Sodium: 140 meq/L (ref 135–145)
Total Bilirubin: 0.2 mg/dL — ABNORMAL LOW (ref 0.3–1.2)
Total CHOL/HDL Ratio: 6.8
Total Protein: 7.3 g/dL (ref 6.0–8.3)
Triglycerides: 230 mg/dL — ABNORMAL HIGH (ref <150)
VLDL: 46 mg/dL — ABNORMAL HIGH (ref 0–40)
WBC: 9.4 10*3/uL (ref 4.0–10.5)

## 2009-03-05 ENCOUNTER — Ambulatory Visit: Payer: Self-pay | Admitting: Internal Medicine

## 2009-03-22 ENCOUNTER — Telehealth (INDEPENDENT_AMBULATORY_CARE_PROVIDER_SITE_OTHER): Payer: Self-pay | Admitting: *Deleted

## 2009-03-27 IMAGING — CR DG FOOT COMPLETE 3+V*R*
3 series · 3 of 3 positions shown · non-contrast
Comparison: none

CLINICAL DATA: Right foot pain. 
 DIAGNOSTIC RIGHT FOOT ? 3 VIEW:

[t foot ap right]
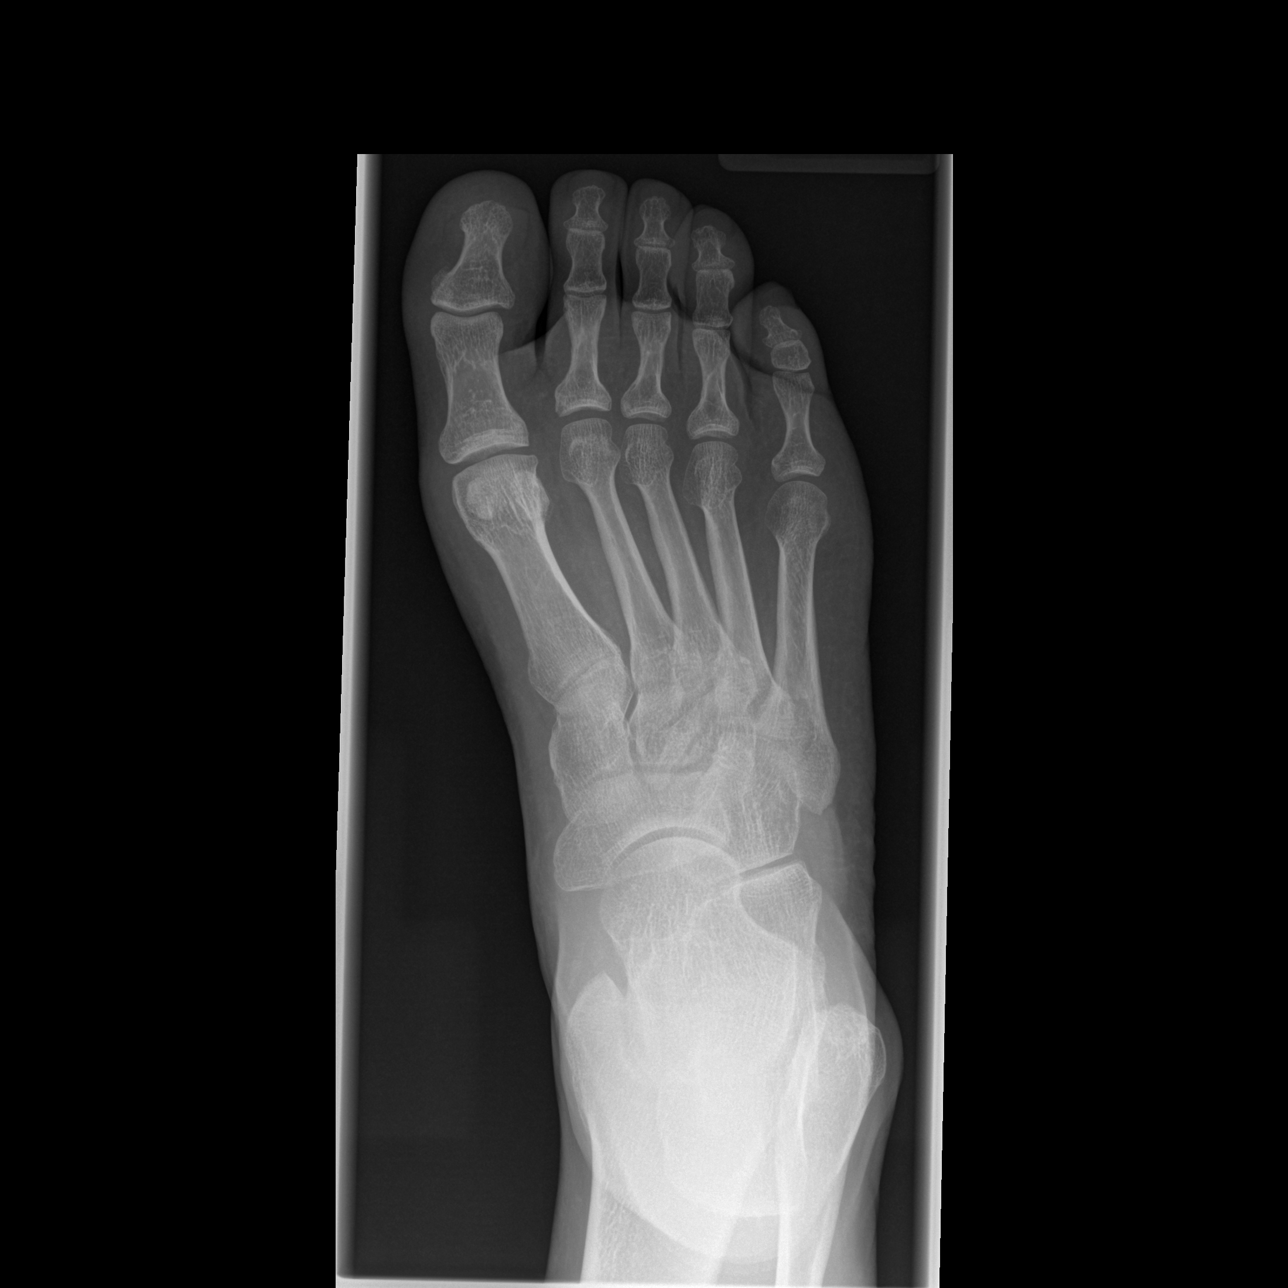

[t foot oblique right]
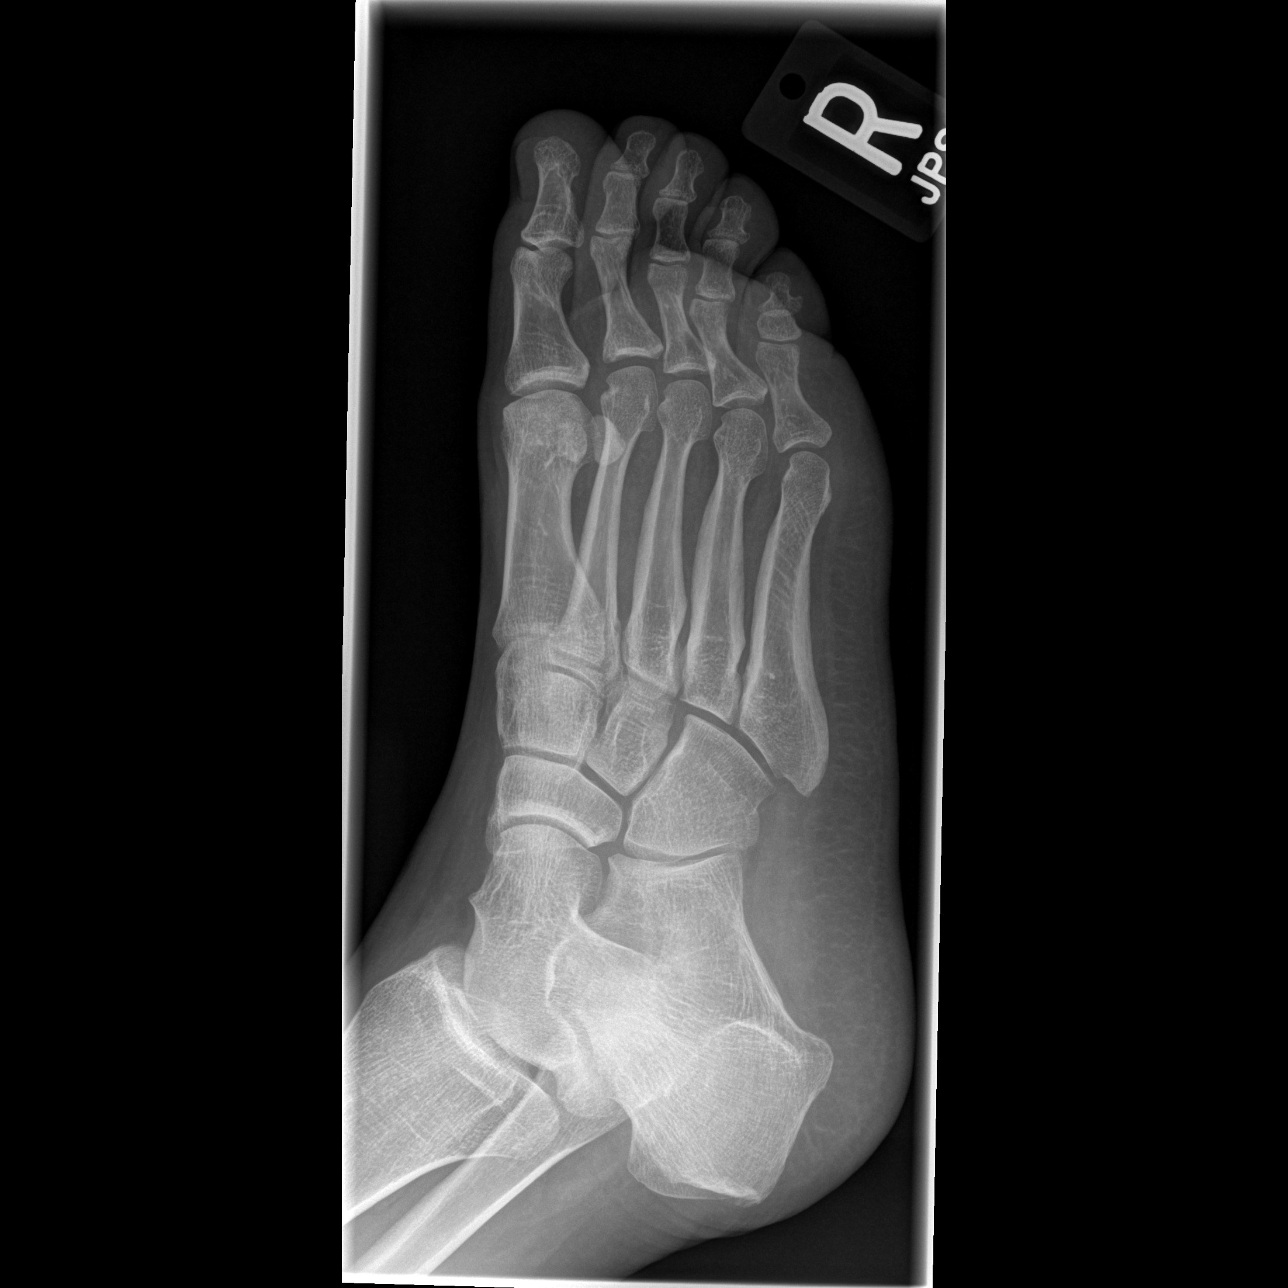

[t foot lat right]
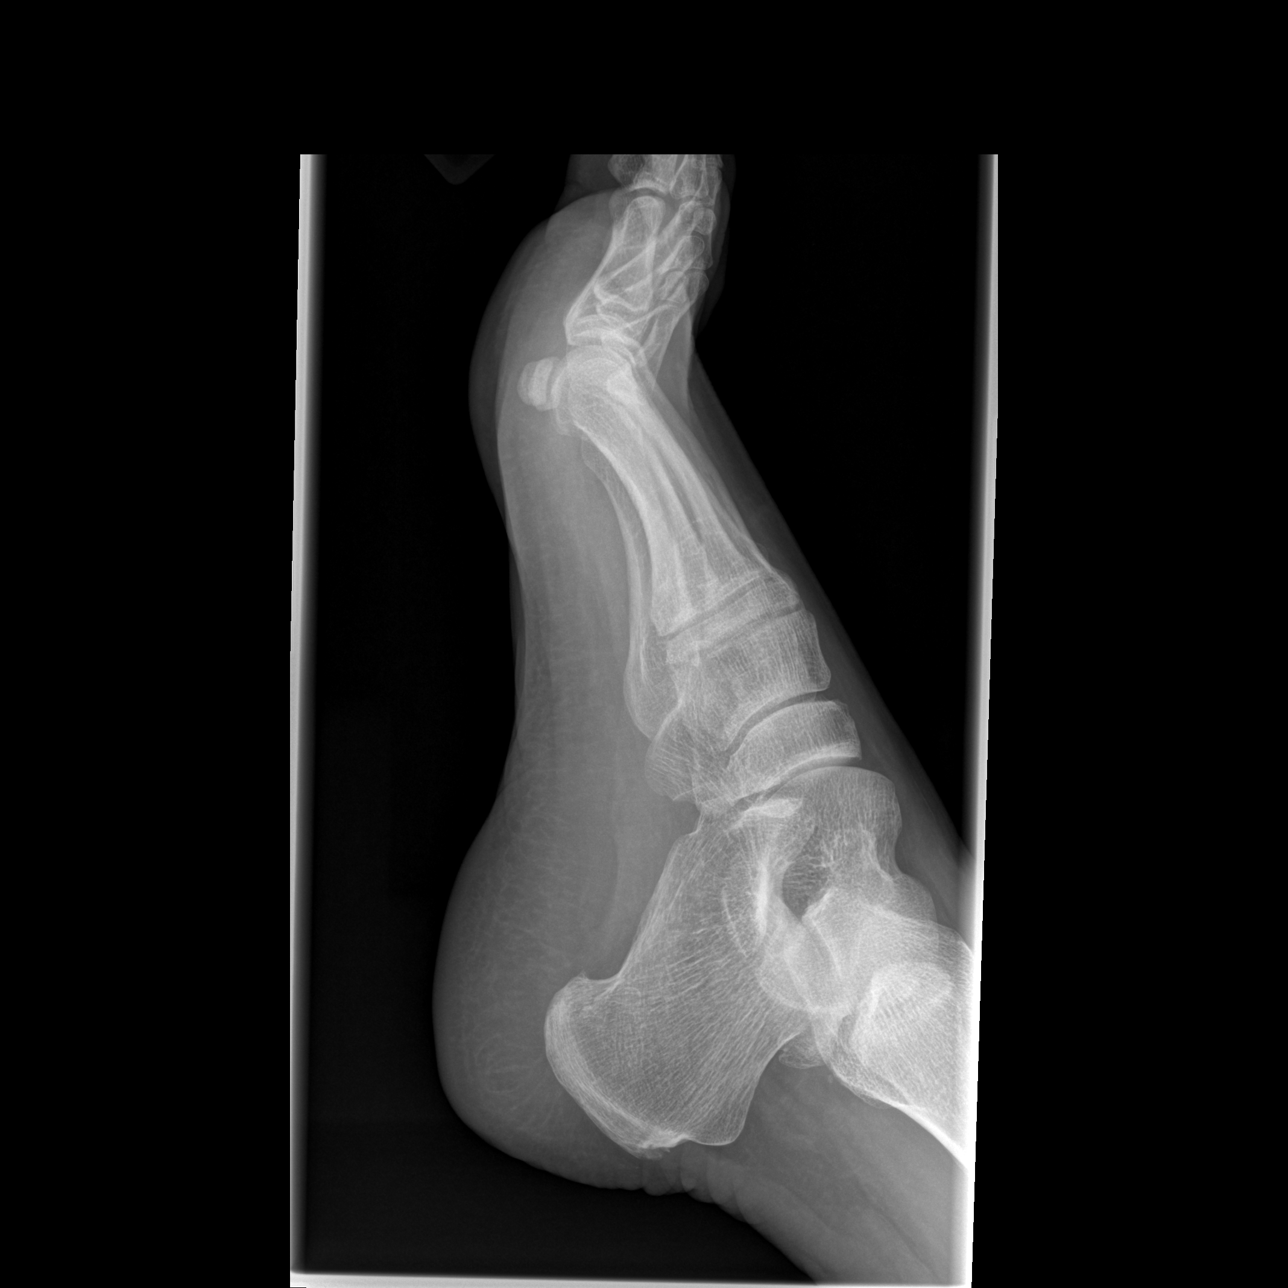

[3 of 3 positions shown; findings below may reference images not displayed]

FINDINGS: There is no evidence of fracture or dislocation.  There is no evidence of arthropathy or other focal bone abnormality.  Soft tissues are unremarkable.
IMPRESSION: Negative.

## 2009-05-19 ENCOUNTER — Telehealth (INDEPENDENT_AMBULATORY_CARE_PROVIDER_SITE_OTHER): Payer: Self-pay | Admitting: *Deleted

## 2009-06-21 ENCOUNTER — Telehealth (INDEPENDENT_AMBULATORY_CARE_PROVIDER_SITE_OTHER): Payer: Self-pay | Admitting: *Deleted

## 2009-07-01 ENCOUNTER — Ambulatory Visit: Payer: Self-pay | Admitting: Internal Medicine

## 2009-07-01 LAB — CONVERTED CEMR LAB
ALT: 28 units/L (ref 0–53)
AST: 18 units/L (ref 0–37)
Albumin: 4.4 g/dL (ref 3.5–5.2)
Alkaline Phosphatase: 58 units/L (ref 39–117)
BUN: 14 mg/dL (ref 6–23)
Basophils Absolute: 0.1 10*3/uL (ref 0.0–0.1)
Basophils Relative: 1 % (ref 0–1)
CO2: 25 meq/L (ref 19–32)
Calcium: 9 mg/dL (ref 8.4–10.5)
Chloride: 103 meq/L (ref 96–112)
Cholesterol: 187 mg/dL (ref 0–200)
Creatinine, Ser: 0.81 mg/dL (ref 0.40–1.50)
Eosinophils Absolute: 0.2 10*3/uL (ref 0.0–0.7)
Eosinophils Relative: 4 % (ref 0–5)
Glucose, Bld: 107 mg/dL — ABNORMAL HIGH (ref 70–99)
HCT: 46.6 % (ref 39.0–52.0)
HDL: 28 mg/dL — ABNORMAL LOW (ref >39)
HIV 1 RNA Quant: 21400 copies/mL — ABNORMAL HIGH (ref <48)
HIV-1 RNA Quant, Log: 4.33 — ABNORMAL HIGH (ref <1.68)
Hemoglobin: 14.6 g/dL (ref 13.0–17.0)
LDL Cholesterol: 117 mg/dL — ABNORMAL HIGH (ref 0–99)
Lymphocytes Relative: 38 % (ref 12–46)
Lymphs Abs: 2.1 10*3/uL (ref 0.7–4.0)
MCHC: 31.3 g/dL (ref 30.0–36.0)
MCV: 93 fL (ref 78.0–100.0)
Monocytes Absolute: 0.6 10*3/uL (ref 0.1–1.0)
Monocytes Relative: 11 % (ref 3–12)
Neutro Abs: 2.6 10*3/uL (ref 1.7–7.7)
Neutrophils Relative %: 46 % (ref 43–77)
Platelets: 249 10*3/uL (ref 150–400)
Potassium: 5 meq/L (ref 3.5–5.3)
RBC: 5.01 M/uL (ref 4.22–5.81)
RDW: 14.2 % (ref 11.5–15.5)
Sodium: 139 meq/L (ref 135–145)
Total Bilirubin: 0.2 mg/dL — ABNORMAL LOW (ref 0.3–1.2)
Total CHOL/HDL Ratio: 6.7
Total Protein: 7.1 g/dL (ref 6.0–8.3)
Triglycerides: 208 mg/dL — ABNORMAL HIGH (ref <150)
VLDL: 42 mg/dL — ABNORMAL HIGH (ref 0–40)
WBC: 5.6 10*3/uL (ref 4.0–10.5)

## 2009-07-15 ENCOUNTER — Ambulatory Visit: Payer: Self-pay | Admitting: Internal Medicine

## 2009-07-15 DIAGNOSIS — K029 Dental caries, unspecified: Secondary | ICD-10-CM

## 2009-07-15 DIAGNOSIS — Z7689 Persons encountering health services in other specified circumstances: Secondary | ICD-10-CM | POA: Insufficient documentation

## 2009-07-27 LAB — CONVERTED CEMR LAB

## 2009-07-30 ENCOUNTER — Ambulatory Visit: Payer: Self-pay | Admitting: Internal Medicine

## 2009-07-30 DIAGNOSIS — R252 Cramp and spasm: Secondary | ICD-10-CM

## 2009-09-13 ENCOUNTER — Ambulatory Visit: Payer: Self-pay | Admitting: Internal Medicine

## 2009-09-13 LAB — CONVERTED CEMR LAB
ALT: 21 units/L (ref 0–53)
AST: 19 units/L (ref 0–37)
Albumin: 4.3 g/dL (ref 3.5–5.2)
Alkaline Phosphatase: 65 units/L (ref 39–117)
BUN: 14 mg/dL (ref 6–23)
Basophils Absolute: 0.1 10*3/uL (ref 0.0–0.1)
Basophils Relative: 1 % (ref 0–1)
CO2: 22 meq/L (ref 19–32)
Calcium: 9.8 mg/dL (ref 8.4–10.5)
Chloride: 108 meq/L (ref 96–112)
Cholesterol: 251 mg/dL — ABNORMAL HIGH (ref 0–200)
Creatinine, Ser: 0.81 mg/dL (ref 0.40–1.50)
Eosinophils Absolute: 0.2 10*3/uL (ref 0.0–0.7)
Eosinophils Relative: 2 % (ref 0–5)
Glucose, Bld: 112 mg/dL — ABNORMAL HIGH (ref 70–99)
HCT: 48.3 % (ref 39.0–52.0)
HDL: 40 mg/dL (ref >39)
HIV 1 RNA Quant: 1030 copies/mL — ABNORMAL HIGH (ref <48)
HIV-1 RNA Quant, Log: 3.01 — ABNORMAL HIGH (ref <1.68)
Hemoglobin: 15.4 g/dL (ref 13.0–17.0)
LDL Cholesterol: 169 mg/dL — ABNORMAL HIGH (ref 0–99)
Lymphocytes Relative: 36 % (ref 12–46)
Lymphs Abs: 3 10*3/uL (ref 0.7–4.0)
MCHC: 31.9 g/dL (ref 30.0–36.0)
MCV: 92.4 fL (ref 78.0–100.0)
Monocytes Absolute: 0.7 10*3/uL (ref 0.1–1.0)
Monocytes Relative: 8 % (ref 3–12)
Neutro Abs: 4.3 10*3/uL (ref 1.7–7.7)
Neutrophils Relative %: 52 % (ref 43–77)
Platelets: 277 10*3/uL (ref 150–400)
Potassium: 5.4 meq/L — ABNORMAL HIGH (ref 3.5–5.3)
RBC: 5.23 M/uL (ref 4.22–5.81)
RDW: 14.4 % (ref 11.5–15.5)
Sodium: 140 meq/L (ref 135–145)
Total Bilirubin: 0.2 mg/dL — ABNORMAL LOW (ref 0.3–1.2)
Total CHOL/HDL Ratio: 6.3
Total Protein: 7.8 g/dL (ref 6.0–8.3)
Triglycerides: 212 mg/dL — ABNORMAL HIGH (ref <150)
VLDL: 42 mg/dL — ABNORMAL HIGH (ref 0–40)
WBC: 8.3 10*3/uL (ref 4.0–10.5)

## 2009-09-14 ENCOUNTER — Telehealth: Payer: Self-pay | Admitting: Internal Medicine

## 2009-09-17 ENCOUNTER — Telehealth: Payer: Self-pay | Admitting: Internal Medicine

## 2009-09-29 ENCOUNTER — Ambulatory Visit: Payer: Self-pay | Admitting: Internal Medicine

## 2010-02-18 ENCOUNTER — Telehealth: Payer: Self-pay | Admitting: Internal Medicine

## 2010-02-18 ENCOUNTER — Telehealth (INDEPENDENT_AMBULATORY_CARE_PROVIDER_SITE_OTHER): Payer: Self-pay | Admitting: *Deleted

## 2010-03-24 ENCOUNTER — Encounter: Payer: Self-pay | Admitting: Internal Medicine

## 2010-03-24 ENCOUNTER — Ambulatory Visit
Admission: RE | Admit: 2010-03-24 | Discharge: 2010-03-24 | Payer: Self-pay | Source: Home / Self Care | Attending: Internal Medicine | Admitting: Internal Medicine

## 2010-03-25 ENCOUNTER — Telehealth (INDEPENDENT_AMBULATORY_CARE_PROVIDER_SITE_OTHER): Payer: Self-pay | Admitting: *Deleted

## 2010-03-25 ENCOUNTER — Encounter: Payer: Self-pay | Admitting: Internal Medicine

## 2010-03-25 LAB — CONVERTED CEMR LAB
ALT: 23 units/L (ref 0–53)
AST: 19 units/L (ref 0–37)
Albumin: 4.3 g/dL (ref 3.5–5.2)
Alkaline Phosphatase: 64 units/L (ref 39–117)
BUN: 12 mg/dL (ref 6–23)
Basophils Absolute: 0.1 10*3/uL (ref 0.0–0.1)
Basophils Relative: 1 % (ref 0–1)
CO2: 27 meq/L (ref 19–32)
Calcium: 9.3 mg/dL (ref 8.4–10.5)
Chloride: 104 meq/L (ref 96–112)
Creatinine, Ser: 0.93 mg/dL (ref 0.40–1.50)
Eosinophils Absolute: 0.2 10*3/uL (ref 0.0–0.7)
Eosinophils Relative: 3 % (ref 0–5)
Glucose, Bld: 93 mg/dL (ref 70–99)
HCT: 46.9 % (ref 39.0–52.0)
HIV 1 RNA Quant: 190 copies/mL — ABNORMAL HIGH (ref <20)
HIV-1 RNA Quant, Log: 2.28 — ABNORMAL HIGH (ref <1.30)
Hemoglobin: 15.3 g/dL (ref 13.0–17.0)
Lymphocytes Relative: 42 % (ref 12–46)
Lymphs Abs: 3 10*3/uL (ref 0.7–4.0)
MCHC: 32.6 g/dL (ref 30.0–36.0)
MCV: 89.5 fL (ref 78.0–100.0)
Monocytes Absolute: 0.6 10*3/uL (ref 0.1–1.0)
Monocytes Relative: 8 % (ref 3–12)
Neutro Abs: 3.5 10*3/uL (ref 1.7–7.7)
Neutrophils Relative %: 47 % (ref 43–77)
Platelets: 281 10*3/uL (ref 150–400)
Potassium: 5.4 meq/L — ABNORMAL HIGH (ref 3.5–5.3)
RBC: 5.24 M/uL (ref 4.22–5.81)
RDW: 13.7 % (ref 11.5–15.5)
Sodium: 138 meq/L (ref 135–145)
Total Bilirubin: 0.2 mg/dL — ABNORMAL LOW (ref 0.3–1.2)
Total Protein: 8 g/dL (ref 6.0–8.3)
WBC: 7.3 10*3/uL (ref 4.0–10.5)

## 2010-03-25 LAB — T-HELPER CELL (CD4) - (RCID CLINIC ONLY)
CD4 % Helper T Cell: 27 % — ABNORMAL LOW (ref 33–55)
CD4 T Cell Abs: 810 uL (ref 400–2700)

## 2010-04-07 ENCOUNTER — Ambulatory Visit: Admit: 2010-04-07 | Payer: Self-pay | Admitting: Internal Medicine

## 2010-04-19 NOTE — Progress Notes (Signed)
Summary: request refill on pain med/mld  Phone Note Refill Request Message from:  Fax from Pharmacy  Refills Requested: Medication #1:  VICODIN HP 10-660 MG TABS Take 1 tablet by mouth every 8 hours   Last Refilled: 04/20/2009  Method Requested: Telephone to Pharmacy Next Appointment Scheduled: June 30, 2009  Follow-up for Phone Call        ok x 1 Follow-up by: Yisroel Ramming MD,  May 19, 2009 4:01 PM    Prescriptions: VICODIN HP 10-660 MG TABS (HYDROCODONE-ACETAMINOPHEN) Take 1 tablet by mouth every 8 hours  #90 x 1   Entered by:   Paulo Fruit  BS,CPht II,MPH   Authorized by:   Yisroel Ramming MD   Signed by:   Paulo Fruit  BS,CPht II,MPH on 05/19/2009   Method used:   Telephoned to ...       [facility] (retail)       [address]       [place], [place]  [address]       Ph: [phone]       Fax: [phone]   RxID:   [id number]  Paulo Fruit  BS,CPht II,MPH  May 19, 2009 4:57 PM

## 2010-04-19 NOTE — Progress Notes (Signed)
Summary: Message left for pt. to call for lab and MD appt.  Phone Note Outgoing Call   Call placed by: Jennet Maduro RN,  February 18, 2010 11:49 AM Call placed to: Patient Summary of Call: Message left for pt. to call for appointments for labs and MD.  He has missed his last couple of appts. Jennet Maduro RN  February 18, 2010 11:50 AM

## 2010-04-19 NOTE — Assessment & Plan Note (Signed)
Summary: F/U/VS   CC:  follow-up visit, lab results, pt. never started new meds, and still taking Atripla.  History of Present Illness: Pt has recently gotten insurance and did not know what his co-pays for his new meds were going to be so stayed on the Aripla which he was receiving through [facility]. He states that he has some leg pain from standing ato work but is overall glad that he is back to work.  Preventive Screening-Counseling & Management  Alcohol-Tobacco     Alcohol drinks/day: 0     Smoking Status: current     Smoking Cessation Counseling: yes     Packs/Day: 1.0     Passive Smoke Exposure: yes  Caffeine-Diet-Exercise     Caffeine use/day: coffee,tea     Does Patient Exercise: no  Hep-HIV-STD-Contraception     HIV Risk: yes  Safety-Violence-Falls     Seat Belt Use: 100      Drug Use:  no.    Comments: pt. given condoms   Current Allergies (reviewed today): No known allergies  Past History:  Past Medical History: Last updated: 02/02/2006 HIV disease Depression  Review of Systems  The patient denies anorexia, fever, and weight loss.    Vital Signs:  Patient profile:   45 year old male Height:      69 inches (175.26 cm) Weight:      227.8 pounds (103.55 kg) BMI:     33.76 Temp:     98.1 degrees F (36.72 degrees C) oral Pulse rate:   94 / minute BP sitting:   139 / 96  (right arm)  Vitals Entered By: Wendall Mola CMA Duncan Dull) (September 29, 2009 10:22 AM) CC: follow-up visit, lab results, pt. never started new meds, still taking Atripla Is Patient Diabetic? No Pain Assessment Patient in pain? yes     Location: feet Intensity: 5 Type: burning Onset of pain  Constant Nutritional Status BMI of > 30 = obese Nutritional Status Detail appetite "good"  Does patient need assistance? Functional Status Self care Ambulation Normal   Physical Exam  General:  alert, well-developed, well-nourished, and well-hydrated.   Head:  normocephalic  and atraumatic.   Lungs:  normal breath sounds.      Impression & Recommendations:  Problem # 1:  HIV DISEASE (ICD-042) I disccussed the fact that he has resistance to the sustiva part of his Atripla and it is essential for him to stop the atripla and change to the new regimen so he will not continue to develop resistance to the other components of the Atripla.  I will refer him to Byrd Hesselbach so she can help inform him about how  his insurance will work. He will return for labs once on the new regimen. Diagnostics Reviewed:  HIV: HIV positive - not AIDS (08/05/2008)   CD4: 890 (09/14/2009)   WBC: 8.3 (09/13/2009)   Hgb: 15.4 (09/13/2009)   HCT: 48.3 (09/13/2009)   Platelets: 277 (09/13/2009) HIV genotype: See Comment (07/15/2009)   HIV-1 RNA: 1030 (09/13/2009)   HBSAg: NO (05/14/2006)  Other Orders: Est. Patient Level III ([address]) Future Orders: T-CD4SP ([facility]) (CD4SP) ... 12/28/2009 T-HIV Viral Load [id number]) ... 12/28/2009 T-Comprehensive Metabolic Panel [id number]) ... 12/28/2009 T-CBC w/Diff ([id number]) ... 12/28/2009  Patient Instructions: 1)  Please schedule a follow-up appointment in 3 months, 2 weeks after labs.

## 2010-04-19 NOTE — Assessment & Plan Note (Signed)
Summary: f/u appt. /dde   CC:  pt. to start new regimen and c/o rash on hands and feet and leg cramps at night.  History of Present Illness: Pt c/o rash on hands and feet for about a week.  Initially it was pruritic but that is resolving. No new soaps, detergents, lotions or meds.  Pt also c/o intermittant severe cramps in his legs.  Preventive Screening-Counseling & Management  Alcohol-Tobacco     Alcohol drinks/day: 0     Smoking Status: current     Smoking Cessation Counseling: yes     Packs/Day: 1.0     Passive Smoke Exposure: yes  Caffeine-Diet-Exercise     Caffeine use/day: coffee,tea     Does Patient Exercise: no  Hep-HIV-STD-Contraception     HIV Risk: yes  Safety-Violence-Falls     Seat Belt Use: 100      Drug Use:  no.     Updated Prior Medication List: TRIAMCINOLONE ACETONIDE 0.1 %  CREA (TRIAMCINOLONE ACETONIDE) apply two times a day VICODIN HP 10-660 MG TABS (HYDROCODONE-ACETAMINOPHEN) Take 1 tablet by mouth every 8 hours ATENOLOL 50 MG TABS (ATENOLOL) Take 1 tablet by mouth once a day TRUVADA 200-300 MG TABS (EMTRICITABINE-TENOFOVIR) Take 1 tablet by mouth once a day REYATAZ 300 MG CAPS (ATAZANAVIR SULFATE) Take 1 capsule by mouth once a day NORVIR 100 MG TABS (RITONAVIR) Take 1 tablet by mouth once a day  Current Allergies (reviewed today): No known allergies  Past History:  Past Medical History: Last updated: 02/02/2006 HIV disease Depression  Review of Systems  The patient denies anorexia, fever, and weight loss.    Vital Signs:  Patient profile:   45 year old male Height:      69 inches (175.26 cm) Weight:      229.8 pounds (104.45 kg) BMI:     34.06 Temp:     97.8 degrees F (36.56 degrees C) oral Pulse rate:   98 / minute BP sitting:   147 / 89  (right arm)  Vitals Entered By: Wendall Mola CMA Duncan Dull) (Jul 30, 2009 2:02 PM) CC: pt. to start new regimen, c/o rash on hands and feet and leg cramps at night Is Patient Diabetic?  No Pain Assessment Patient in pain? no      Nutritional Status BMI of > 30 = obese Nutritional Status Detail appetite "lower than usual"  Does patient need assistance? Functional Status Self care Ambulation Normal Comments pt. stopped Atripla 5 days ago   Physical Exam  General:  alert, well-developed, well-nourished, and well-hydrated.   Head:  normocephalic and atraumatic.   Mouth:  pharynx pink and moist.   Lungs:  normal breath sounds.      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 We discussed potential treatment options. he will start Truvada, Reyataz and Norvir.  Potential side effects were discussed. He will return in 6 weeks for repeat labs.  Pt encouraged to take his meds every day. Diagnostics Reviewed:  HIV: HIV positive - not AIDS (08/05/2008)   CD4: 810 (07/01/2009)   WBC: 5.6 (07/01/2009)   Hgb: 14.6 (07/01/2009)   HCT: 46.6 (07/01/2009)   Platelets: 249 (07/01/2009) HIV genotype: See Comment (07/15/2009)   HIV-1 RNA: 21400 (07/01/2009)   HBSAg: NO (05/14/2006)  Problem # 2:  LEG CRAMPS (ICD-729.82) Probably due to pravachol.  Will Dc and see if they resolve.  Medications Added to Medication  List This Visit: 1)  Truvada 200-300 Mg Tabs (Emtricitabine-tenofovir) .... Take 1 tablet by mouth once a day 2)  Reyataz 300 Mg Caps (Atazanavir sulfate) .... Take 1 capsule by mouth once a day 3)  Norvir 100 Mg Tabs (Ritonavir) .... Take 1 tablet by mouth once a day  Other Orders: Est. Patient Level IV ([address]) Future Orders: T-CD4SP ([facility]) (CD4SP) ... 09/10/2009 T-HIV Viral Load [id number]) ... 09/10/2009 T-Comprehensive Metabolic Panel [id number]) ... 09/10/2009 T-CBC w/Diff ([id number]) ... 09/10/2009 T-Lipid Profile [phone]) ... 09/10/2009 T-RPR (Syphilis) [id number]) ... 09/10/2009  Patient Instructions: 1)  Please schedule a follow-up  appointment in 8 weeks, 2 weeks after labs.  Prescriptions: NORVIR 100 MG TABS (RITONAVIR) Take 1 tablet by mouth once a day  #30 x 5   Entered and Authorized by:   Yisroel Ramming MD   Signed by:   Yisroel Ramming MD on 07/30/2009   Method used:   Print then Give to Patient   RxID:   [id number] REYATAZ 300 MG CAPS (ATAZANAVIR SULFATE) Take 1 capsule by mouth once a day  #30 x 5   Entered and Authorized by:   Yisroel Ramming MD   Signed by:   Yisroel Ramming MD on 07/30/2009   Method used:   Print then Give to Patient   RxID:   [id number] TRUVADA 200-300 MG TABS (EMTRICITABINE-TENOFOVIR) Take 1 tablet by mouth once a day  #30 x 5   Entered and Authorized by:   Yisroel Ramming MD   Signed by:   Yisroel Ramming MD on 07/30/2009   Method used:   Print then Give to Patient   RxID:   [id number]

## 2010-04-19 NOTE — Progress Notes (Signed)
Summary: Request for Vicodin HP to have on file at pharmacy (not to fill)  Phone Note Refill Request Message from:  Fax from Pharmacy on September 14, 2009 11:56 AM  Refills Requested: Medication #1:  VICODIN HP 10-660 MG TABS Take 1 tablet by mouth every 8 hours Patient requesting a refill not to fill but to place on hold in his file at the pharmacy. [facility] [address]   Method Requested: Telephone to Pharmacy Next Appointment Scheduled: September 29, 2009 Initial call taken by: Paulo Fruit  BS,CPht II,MPH,  September 14, 2009 11:56 AM Caller: [facility] [address] Details for Reason: refill Summary of Call: Received a fax request for patient's Vicodin HP from the pharmacy.  Patient is asking for a refill to hold on file, not one to fill ASAP. Initial call taken by: Paulo Fruit  BS,CPht II,MPH,  September 14, 2009 11:55 AM  Follow-up for Phone Call        I am unable to do that.  He can call when it is time to refill. Follow-up by: Yisroel Ramming MD,  September 14, 2009 3:25 PM  Additional Follow-up for Phone Call Additional follow up Details #1::        Pharmacy notified to inform patient to call office when it it time for the refill.   Additional Follow-up by: Paulo Fruit  BS,CPht II,MPH,  September 14, 2009 4:46 PM

## 2010-04-19 NOTE — Progress Notes (Signed)
Summary: requesting pain rx refill  Phone Note From Pharmacy   Caller: [facility], [address] [place] [phone] Summary of Call: request for refill on hydrocodone/apap 10/660.  Pt. has no showed for his last couple of visit.  Message left with his mother to have him call the Center to make appt. for labs and MD.  Do you want to refill pain rx?  Please let J. Jennye Moccasin, CMA know your response.  Jennet Maduro RN  February 18, 2010 11:52 AM   Follow-up for Phone Call        no Follow-up by: Yisroel Ramming MD,  February 18, 2010 2:14 PM     Appended Document: requesting pain rx refill pt. notified, needs appt. before pain med refill

## 2010-04-19 NOTE — Progress Notes (Signed)
Summary: Refill  Phone Note Call from Patient   Caller: Patient Reason for Call: Refill Medication Details for Reason: Request vicodin refill Summary of Call: Pt. called requesting Vicodin refill.  Called to [facility] [address] Initial call taken by: Wendall Mola CMA Duncan Dull),  September 17, 2009 4:30 PM  Follow-up for Phone Call        ok Follow-up by: Yisroel Ramming MD,  September 26, 2009 3:47 PM

## 2010-04-19 NOTE — Progress Notes (Signed)
Summary: refill request/mld  Phone Note Refill Request   Refills Requested: Medication #1:  VICODIN HP 10-660 MG TABS Take 1 tablet by mouth every 8 hours   Dosage confirmed as above?Dosage Confirmed   Last Refilled: 05/19/2009 Please call [facility] [address] with approval  Initial call taken by: Paulo Fruit  BS,CPht II,MPH,  June 21, 2009 10:29 AM Caller: Patient Reason for Call: Refill Medication Reason for Call: Needs renewal  Follow-up for Phone Call        ok x 1 Follow-up by: Yisroel Ramming MD,  June 21, 2009 11:19 AM    Prescriptions: VICODIN HP 10-660 MG TABS (HYDROCODONE-ACETAMINOPHEN) Take 1 tablet by mouth every 8 hours  #90 x 1   Entered by:   Paulo Fruit  BS,CPht II,MPH   Authorized by:   Yisroel Ramming MD   Signed by:   Paulo Fruit  BS,CPht II,MPH on 06/21/2009   Method used:   Telephoned to ...       [facility] (retail)       [address]       [place], [place]  [address]       Ph: [phone]       Fax: [phone]   RxID:   [id number]  Paulo Fruit  BS,CPht II,MPH  June 21, 2009 4:40 PM

## 2010-04-19 NOTE — Assessment & Plan Note (Signed)
Summary: 2WK F/U/VS   CC:  follow-up visit, lab results, pt requesting pain med refill, and has a bad tooth and feet pain.  History of Present Illness: Pt here for f/u.  He has a painful loose tooth.  He has been putting off going to the dentist due to the expense but now has insurance. He stats that he has been taking his atripla every day.  Preventive Screening-Counseling & Management  Alcohol-Tobacco     Alcohol drinks/day: 0     Smoking Status: current     Smoking Cessation Counseling: yes     Packs/Day: 1.0     Passive Smoke Exposure: yes  Caffeine-Diet-Exercise     Caffeine use/day: coffee,tea     Does Patient Exercise: no  Hep-HIV-STD-Contraception     HIV Risk: yes  Safety-Violence-Falls     Seat Belt Use: 100      Drug Use:  no.     Updated Prior Medication List: ATRIPLA 600-200-300 MG TABS (EFAVIRENZ-EMTRICITAB-TENOFOVIR) Take 1 tablet by mouth at bedtime TRIAMCINOLONE ACETONIDE 0.1 %  CREA (TRIAMCINOLONE ACETONIDE) apply two times a day VICODIN HP 10-660 MG TABS (HYDROCODONE-ACETAMINOPHEN) Take 1 tablet by mouth every 8 hours ATENOLOL 50 MG TABS (ATENOLOL) Take 1 tablet by mouth once a day PRAVACHOL 40 MG TABS (PRAVASTATIN SODIUM) Take 1 tablet by mouth once a day  Current Allergies (reviewed today): No known allergies  Past History:  Past Medical History: Last updated: 02/02/2006 HIV disease Depression  Review of Systems  The patient denies anorexia, fever, and weight loss.    Vital Signs:  Patient profile:   45 year old male Height:      69 inches (175.26 cm) Weight:      234.8 pounds (106.73 kg) BMI:     34.80 Temp:     98.0 degrees F (36.67 degrees C) oral Pulse rate:   89 / minute BP sitting:   128 / 84  (right arm)  Vitals Entered By: Wendall Mola CMA Duncan Dull) (July 15, 2009 8:59 AM) CC: follow-up visit, lab results, pt requesting pain med refill, has a bad tooth and feet pain Is Patient Diabetic? No Pain Assessment Patient in  pain? yes     Location: feet Intensity: 7 Type: burning and cramping Onset of pain  Constant Nutritional Status BMI of > 30 = obese Nutritional Status Detail appetite "good"  Does patient need assistance? Functional Status Self care Ambulation Normal Comments no missed doses of meds per patient   Physical Exam  General:  alert, well-developed, well-nourished, and well-hydrated.   Head:  normocephalic and atraumatic.   Mouth:  pharynx pink and moist.  no thrush loose molar on lower left and an impacted wisdom tooth Lungs:  normal breath sounds.     Impression & Recommendations:  Problem # 1:  HIV DISEASE (ICD-042) VL increased - will obtain a genotype to see if he is developing resistance.  He will return in 3 weeks for results. Orders: Est. Patient Level III ([address]) T-HIV Genotype ([id number])  Diagnostics Reviewed:  HIV: HIV positive - not AIDS (08/05/2008)   CD4: 810 (07/01/2009)   WBC: 5.6 (07/01/2009)   Hgb: 14.6 (07/01/2009)   HCT: 46.6 (07/01/2009)   Platelets: 249 (07/01/2009) HIV-1 RNA: 21400 (07/01/2009)   HBSAg: NO (05/14/2006)  Problem # 2:  HYPERLIPIDEMIA (ICD-272.4) Assessment: Improved  His updated medication list for this problem includes:    Pravachol 40 Mg Tabs (Pravastatin sodium) .Marland Kitchen... Take 1 tablet by mouth once a day  Problem #  3:  DENTAL CARIES (ICD-521.00) refer to Dentist  Patient Instructions: 1)  Please schedule a follow-up appointment in 3 weeks. Prescriptions: VICODIN HP 10-660 MG TABS (HYDROCODONE-ACETAMINOPHEN) Take 1 tablet by mouth every 8 hours  #90 x 1   Entered and Authorized by:   Yisroel Ramming MD   Signed by:   Yisroel Ramming MD on 07/15/2009   Method used:   Print then Give to Patient   RxID:   [id number]

## 2010-04-19 NOTE — Progress Notes (Signed)
Summary: Pain med refill request  Phone Note Refill Request Message from:  Fax from Pharmacy  Refills Requested: Medication #1:  VICODIN HP 10-660 MG TABS Take 1 tablet by mouth every 8 hours   Last Refilled: 02/19/2009 Patient requesting refill.  [facility] [address] [place]   Method Requested: Telephone to Pharmacy Next Appointment Scheduled: June 18, 2009  Follow-up for Phone Call        ok x 1 Follow-up by: Yisroel Ramming MD,  March 23, 2009 3:05 PM    Prescriptions: VICODIN HP 10-660 MG TABS (HYDROCODONE-ACETAMINOPHEN) Take 1 tablet by mouth every 8 hours  #90 x 1   Entered by:   Paulo Fruit  BS,CPht II,MPH   Authorized by:   Yisroel Ramming MD   Signed by:   Paulo Fruit  BS,CPht II,MPH on 03/23/2009   Method used:   Telephoned to ...       [facility] (retail)       [address]       [place], [place]  [address]       Ph: [phone]       Fax: [phone]   RxID:   [id number]  Paulo Fruit  BS,CPht II,MPH  March 23, 2009 4:44 PM

## 2010-04-21 NOTE — Progress Notes (Signed)
Summary: Return call re: repeat labs   Phone Note Call from Patient   Summary of Call: Pt states he lives in [facility] and does not wan to drive back to [place]. He would like to wait for his return OV to repeat labs.  Tomasita Morrow RN  March 25, 2010 12:00 PM Please advise.   Follow-up for Phone Call        ok K not that elevated if he feels bad needs to golocal to have it re-checked Follow-up by: Yisroel Ramming MD,  March 25, 2010 4:09 PM  Additional Follow-up for Phone Call Additional follow up Details #1::        Pt. notified  Additional Follow-up by: Wendall Mola CMA Duncan Dull),  March 25, 2010 4:27 PM

## 2010-04-21 NOTE — Progress Notes (Signed)
Summary: pt. needs a lab appt.  Phone Note Outgoing Call   Call placed by: Annice Pih Summary of Call: Left message for pt. to call and schedule a lab appt.  He a BMP Initial call taken by: Wendall Mola CMA Duncan Dull),  March 25, 2010 11:56 AM

## 2010-06-03 ENCOUNTER — Telehealth: Payer: Self-pay | Admitting: *Deleted

## 2010-06-05 LAB — T-HELPER CELL (CD4) - (RCID CLINIC ONLY)
CD4 % Helper T Cell: 30 % — ABNORMAL LOW (ref 33–55)
CD4 T Cell Abs: 890 uL (ref 400–2700)

## 2010-06-07 ENCOUNTER — Telehealth: Payer: Self-pay | Admitting: *Deleted

## 2010-06-07 NOTE — Telephone Encounter (Signed)
He called asking for his pain meds. Read him the note from [facility] asking that he make & keep appt before he can get meds. Asked him to call the front & make an appt. He wanted Dr. Hector Brunswick number. Told him she does not work here any more & I do not have a number for her.Steven Collins, Esperanza Richters

## 2010-06-07 NOTE — Progress Notes (Signed)
Summary: Pain rx request.  Last OV 09/29/09.  Needs OV to discuss rx rf  Phone Note From Pharmacy   Caller: [facility], [address] [place] [phone], fax [phone] Summary of Call: Fax received for Hydrocodone/APAP 10/660 refill.  Pt. has not kept last OV visit.  Has not been seen since 09/29/2009.  Pt. needs to make and keep OV to discuss pain rx refills.  Jennet Maduro RN  June 03, 2010 4:31 PM

## 2010-06-08 LAB — T-HELPER CELL (CD4) - (RCID CLINIC ONLY)
CD4 % Helper T Cell: 37 % (ref 33–55)
CD4 T Cell Abs: 810 uL (ref 400–2700)

## 2010-06-21 LAB — T-HELPER CELL (CD4) - (RCID CLINIC ONLY)
CD4 % Helper T Cell: 35 % (ref 33–55)
CD4 T Cell Abs: 1300 uL (ref 400–2700)

## 2010-06-25 LAB — T-HELPER CELL (CD4) - (RCID CLINIC ONLY)
CD4 % Helper T Cell: 33 % (ref 33–55)
CD4 T Cell Abs: 1150 uL (ref 400–2700)

## 2010-06-28 LAB — T-HELPER CELL (CD4) - (RCID CLINIC ONLY)
CD4 % Helper T Cell: 33 % (ref 33–55)
CD4 T Cell Abs: 940 uL (ref 400–2700)

## 2010-07-04 LAB — T-HELPER CELL (CD4) - (RCID CLINIC ONLY)
CD4 % Helper T Cell: 32 % — ABNORMAL LOW (ref 33–55)
CD4 T Cell Abs: 1150 uL (ref 400–2700)

## 2010-12-15 LAB — T-HELPER CELL (CD4) - (RCID CLINIC ONLY): CD4 % Helper T Cell: 33

## 2010-12-19 LAB — T-HELPER CELL (CD4) - (RCID CLINIC ONLY)
CD4 % Helper T Cell: 35
CD4 T Cell Abs: 1090

## 2010-12-27 LAB — T-HELPER CELL (CD4) - (RCID CLINIC ONLY)
CD4 % Helper T Cell: 29 — ABNORMAL LOW
CD4 T Cell Abs: 730

## 2014-09-22 ENCOUNTER — Encounter: Payer: Self-pay | Admitting: Internal Medicine

## 2014-09-22 ENCOUNTER — Ambulatory Visit (INDEPENDENT_AMBULATORY_CARE_PROVIDER_SITE_OTHER): Payer: 59 | Admitting: Internal Medicine

## 2014-09-22 ENCOUNTER — Encounter: Payer: Self-pay | Admitting: *Deleted

## 2014-09-22 VITALS — BP 120/73 | HR 87 | Temp 99.4°F | Ht 64.0 in | Wt 155.0 lb

## 2014-09-22 DIAGNOSIS — B370 Candidal stomatitis: Secondary | ICD-10-CM | POA: Diagnosis not present

## 2014-09-22 DIAGNOSIS — Z79899 Other long term (current) drug therapy: Secondary | ICD-10-CM | POA: Diagnosis not present

## 2014-09-22 DIAGNOSIS — B20 Human immunodeficiency virus [HIV] disease: Secondary | ICD-10-CM

## 2014-09-22 DIAGNOSIS — Z113 Encounter for screening for infections with a predominantly sexual mode of transmission: Secondary | ICD-10-CM | POA: Diagnosis not present

## 2014-09-22 LAB — COMPLETE METABOLIC PANEL WITH GFR
ALK PHOS: 61 U/L (ref 39–117)
ALT: 12 U/L (ref 0–53)
AST: 15 U/L (ref 0–37)
Albumin: 3.3 g/dL — ABNORMAL LOW (ref 3.5–5.2)
BILIRUBIN TOTAL: 0.2 mg/dL (ref 0.2–1.2)
BUN: 7 mg/dL (ref 6–23)
CO2: 25 meq/L (ref 19–32)
Calcium: 8.6 mg/dL (ref 8.4–10.5)
Chloride: 104 mEq/L (ref 96–112)
Creat: 0.65 mg/dL (ref 0.50–1.35)
GLUCOSE: 84 mg/dL (ref 70–99)
POTASSIUM: 4.1 meq/L (ref 3.5–5.3)
SODIUM: 136 meq/L (ref 135–145)
TOTAL PROTEIN: 5.8 g/dL — AB (ref 6.0–8.3)

## 2014-09-22 LAB — LIPID PANEL
Cholesterol: 136 mg/dL (ref 0–200)
HDL: 28 mg/dL — AB (ref >=40)
LDL Cholesterol: 69 mg/dL (ref 0–99)
TRIGLYCERIDES: 195 mg/dL — AB (ref <150)
Total CHOL/HDL Ratio: 4.9 Ratio
VLDL: 39 mg/dL (ref 0–40)

## 2014-09-22 MED ORDER — AZITHROMYCIN 600 MG PO TABS: 1200.0000 mg | ORAL_TABLET | ORAL | Status: DC

## 2014-09-22 MED ORDER — FLUCONAZOLE 200 MG PO TABS: 200.0000 mg | ORAL_TABLET | Freq: Every day | ORAL | Status: DC

## 2014-09-22 MED ORDER — SULFAMETHOXAZOLE-TRIMETHOPRIM 800-160 MG PO TABS: 1.0000 | ORAL_TABLET | Freq: Every day | ORAL | Status: DC

## 2014-09-22 MED ORDER — ELVITEG-COBIC-EMTRICIT-TENOFAF 150-150-200-10 MG PO TABS: 1.0000 | ORAL_TABLET | Freq: Every day | ORAL | Status: DC

## 2014-09-22 NOTE — Assessment & Plan Note (Signed)
We will use fluconazole for 14 days.

## 2014-09-22 NOTE — Assessment & Plan Note (Addendum)
Though his recent labs are not available on care everywhere, I suspect his CD4 is under 50. He was prescribed azithromycin, Bactrim and flu, solid and I will represcribed these. I will also have him start on Genvoya. I discussed the need for compliance including take the medication daily and follow-up labs and visits. He is interested in continuing therapy, particularly with recent pneumonia and weight loss. He was given co-pay card information for his Genvoya. I will prescribe his azithromycin and Bactrim prophylaxis. We'll have him get labs today including baseline genotype. He can return in 6 weeks, with labs in 4 weeks.

## 2014-09-22 NOTE — Progress Notes (Signed)
   Subjective:    Patient ID: Steven Collins, male    DOB: June 14, 1965, 49 y.o.   MRN: [id number]  HPI He comes in to reestablish care. He was last seen in 2012 and previously was on a Atripla.  Had not been in any other previous regimens.  Stopped follow-up and stopped taking his medications after that time.  He had been doing well according to him until earlier this year when he began to have pneumonia. He had what was initially diagnosed as bronchitis and then was admitted to [facility] for pneumonia and treated for community-acquired pneumonia but did not the Hospital of's his diagnosis of HIV. He then was discharged but then returned to the hospital at [facility] and was diagnosed with PCP pneumonia. He tells me his CD4 count was 37 there and he was treated for PCP pneumonia and supposed to start in his antiretroviral therapy though did not ever fill his prescription. He comes in here to reestablish care. He also recently had thrush and did clear up though is back again today. He has trouble swallowing. He is here with his fiance who was partner tested. He is interested in getting back on therapy.   Review of Systems  Constitutional: Positive for unexpected weight change. Negative for activity change and fatigue.  HENT: Positive for sore throat and trouble swallowing.   Eyes: Negative for visual disturbance.  Respiratory: Negative for cough and shortness of breath.   Cardiovascular: Negative for leg swelling.  Gastrointestinal: Negative for diarrhea and constipation.  Genitourinary: Negative for discharge and genital sores.  Musculoskeletal: Positive for back pain.  Skin: Negative for rash.  Neurological: Negative for dizziness, light-headedness and headaches.  Hematological: Positive for adenopathy.  Psychiatric/Behavioral: Negative for sleep disturbance.       Objective:   Physical Exam  Constitutional: He appears well-developed and well-nourished. No distress.    HENT:  poor dentition  Eyes: Right eye exhibits no discharge. Left eye exhibits no discharge. No scleral icterus.  Neck:  About one half cm cervical anterior  Cardiovascular: Normal rate, regular rhythm and normal heart sounds.   No murmur heard. Pulmonary/Chest: Effort normal. No respiratory distress. He has no wheezes.  Abdominal: Soft. Bowel sounds are normal. He exhibits no distension. There is no tenderness.  Musculoskeletal: He exhibits no edema.  Lymphadenopathy:    He has cervical adenopathy.  Neurological: He is alert.  Skin: No rash noted.  Psychiatric: He has a normal mood and affect.          Assessment & Plan:

## 2014-09-23 ENCOUNTER — Telehealth: Payer: Self-pay | Admitting: *Deleted

## 2014-09-23 LAB — T-HELPER CELL (CD4) - (RCID CLINIC ONLY)
CD4 T CELL ABS: 50 /uL — AB (ref 400–2700)
CD4 T CELL HELPER: 4 % — AB (ref 33–55)

## 2014-09-23 LAB — CBC WITH DIFFERENTIAL/PLATELET
Basophils Absolute: 0 10*3/uL (ref 0.0–0.1)
Basophils Relative: 1 % (ref 0–1)
EOS ABS: 0.5 10*3/uL (ref 0.0–0.7)
Eosinophils Relative: 11 % — ABNORMAL HIGH (ref 0–5)
HCT: 33.2 % — ABNORMAL LOW (ref 39.0–52.0)
HEMOGLOBIN: 11 g/dL — AB (ref 13.0–17.0)
LYMPHS ABS: 1.4 10*3/uL (ref 0.7–4.0)
LYMPHS PCT: 29 % (ref 12–46)
MCH: 27.8 pg (ref 26.0–34.0)
MCHC: 33.1 g/dL (ref 30.0–36.0)
MCV: 83.8 fL (ref 78.0–100.0)
MPV: 10 fL (ref 8.6–12.4)
Monocytes Absolute: 0.4 10*3/uL (ref 0.1–1.0)
Monocytes Relative: 8 % (ref 3–12)
NEUTROS PCT: 51 % (ref 43–77)
Neutro Abs: 2.5 10*3/uL (ref 1.7–7.7)
PLATELETS: 199 10*3/uL (ref 150–400)
RBC: 3.96 MIL/uL — AB (ref 4.22–5.81)
RDW: 16.9 % — ABNORMAL HIGH (ref 11.5–15.5)
WBC: 4.9 10*3/uL (ref 4.0–10.5)

## 2014-09-23 LAB — RPR

## 2014-09-23 LAB — URINE CYTOLOGY ANCILLARY ONLY
Chlamydia: NEGATIVE
Neisseria Gonorrhea: NEGATIVE

## 2014-09-23 MED ORDER — AZITHROMYCIN 600 MG PO TABS: 1200.0000 mg | ORAL_TABLET | ORAL | Status: DC

## 2014-09-23 NOTE — Telephone Encounter (Signed)
Send it somewhere else. thanks

## 2014-09-23 NOTE — Telephone Encounter (Signed)
Patient unable to fill 2 medications at [facility] "not covered" per fiance, they are unable to order azithromycin in 600 mg).  RN called in azithromycin to [facility] at [facility], requested that they ship it to the patient. Per Ronalee Belts, they will do so. RN contacted pharmacy and insurance company.  Genvoya and Stribild are NOT an option (genvoya - plan exclusion, Stribild - patient needs to try/fail other medication first then go through a prior authorization process).  Any of the newest TAF compounds are not likely to be covered at this time either.  Triumeq is preferred (HLA-B*[id number] not back yet), Complera will be covered.  Patient will need to use [facility] mail order pharmacy.  They will accept the [place] coupon card. RN spoke with patient's fiance.  They have not yet activated the copay card, but will do so.  RN advised her to contact [facility] and set up an account. She verbalized understanding.  Please advise if the patient's Genvoya needs to be changed due to his insurance coverage.  HIV meds need to be sent to [facility] pharmacy. Landis Gandy, RN

## 2014-09-23 NOTE — Telephone Encounter (Signed)
[facility] pharmacy unable to get azithromycin 600mg .  They order it in 250mg  and 500mg  strengths.  Please advise if he can take a different dosage or if I should send the Rx elsewhere.  [facility]. Andree CossHowell, Michelle M, RN

## 2014-09-24 ENCOUNTER — Other Ambulatory Visit: Payer: Self-pay | Admitting: *Deleted

## 2014-09-24 DIAGNOSIS — B20 Human immunodeficiency virus [HIV] disease: Secondary | ICD-10-CM

## 2014-09-24 LAB — HIV-1 RNA ULTRAQUANT REFLEX TO GENTYP+
HIV 1 RNA Quant: 413782 copies/mL — ABNORMAL HIGH (ref <20)
HIV-1 RNA Quant, Log: 5.62 {Log} — ABNORMAL HIGH (ref <1.30)

## 2014-09-24 MED ORDER — EMTRICITAB-RILPIVIR-TENOFOV DF 200-25-300 MG PO TABS: 1.0000 | ORAL_TABLET | Freq: Every day | ORAL | Status: DC

## 2014-09-24 MED ORDER — EMTRICITAB-RILPIVIR-TENOFOV DF 200-25-300 MG PO TABS
1.0000 | ORAL_TABLET | Freq: Every day | ORAL | Status: DC
Start: 2014-09-24 — End: 2014-09-24

## 2014-09-24 NOTE — Telephone Encounter (Signed)
Complera will be fine if he takes it with food. Just forget the azithromycin, not worth the effort.  Triumeq is ok too but need to wait for the HLA test.  thanks

## 2014-09-24 NOTE — Telephone Encounter (Signed)
Spoke with fiance. Complera sent, made sure that she understood it is to be taken with a full meal. Confirmed that azithromycin will be delivered Saturday. No other issues at this time. Thank you

## 2014-09-29 ENCOUNTER — Other Ambulatory Visit: Payer: Self-pay | Admitting: *Deleted

## 2014-09-29 DIAGNOSIS — B20 Human immunodeficiency virus [HIV] disease: Secondary | ICD-10-CM

## 2014-09-29 LAB — HLA B*5701: HLA-B 5701 W/RFLX HLA-B HIGH: NEGATIVE

## 2014-09-29 MED ORDER — EMTRICITAB-RILPIVIR-TENOFOV DF 200-25-300 MG PO TABS: 1.0000 | ORAL_TABLET | Freq: Every day | ORAL | Status: DC

## 2014-09-29 NOTE — Telephone Encounter (Signed)
[facility] had received a prescription for Charlett LangoOdefsey which is not covered by the pt's health insurance.  Per Dr. Luciana Axeomer the pt should be on Complera.  Brantley Flingn gavie the pharmacist a verbal order for the Complera.  Obtained telephone number for the patient to call to set up the delivery of the Complera.  Pt given ph # to call to set up delivery of Complera rx, [phone].

## 2014-09-30 LAB — HIV-1 GENOTYPR PLUS

## 2014-10-02 ENCOUNTER — Telehealth: Payer: Self-pay | Admitting: *Deleted

## 2014-10-02 ENCOUNTER — Other Ambulatory Visit: Payer: Self-pay | Admitting: *Deleted

## 2014-10-02 MED ORDER — DOLUTEGRAVIR SODIUM 50 MG PO TABS: 50.0000 mg | ORAL_TABLET | Freq: Every day | ORAL | Status: DC

## 2014-10-02 NOTE — Telephone Encounter (Signed)
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  He needs to take Tivicay 50 mg daily with Complera for now and then will change to Tivicay with Truvada.  thanks

## 2014-10-02 NOTE — Telephone Encounter (Signed)
New Rx sent to [facility] and patient notified. Steven Collins

## 2014-10-26 ENCOUNTER — Other Ambulatory Visit: Payer: Self-pay

## 2014-10-27 ENCOUNTER — Other Ambulatory Visit: Payer: 59

## 2014-10-27 DIAGNOSIS — B20 Human immunodeficiency virus [HIV] disease: Secondary | ICD-10-CM

## 2014-10-27 LAB — COMPLETE METABOLIC PANEL WITH GFR
ALT: 10 U/L (ref 9–46)
AST: 11 U/L (ref 10–35)
Albumin: 3.4 g/dL — ABNORMAL LOW (ref 3.6–5.1)
Alkaline Phosphatase: 75 U/L (ref 40–115)
BILIRUBIN TOTAL: 0.3 mg/dL (ref 0.2–1.2)
BUN: 13 mg/dL (ref 7–25)
CHLORIDE: 96 mmol/L — AB (ref 98–110)
CO2: 21 mmol/L (ref 20–31)
CREATININE: 0.94 mg/dL (ref 0.70–1.33)
Calcium: 9 mg/dL (ref 8.6–10.3)
GFR, Est African American: >89 mL/min (ref >=60)
Glucose, Bld: 85 mg/dL (ref 65–99)
POTASSIUM: 4.8 mmol/L (ref 3.5–5.3)
SODIUM: 131 mmol/L — AB (ref 135–146)
TOTAL PROTEIN: 6.6 g/dL (ref 6.1–8.1)

## 2014-10-27 LAB — CBC WITH DIFFERENTIAL/PLATELET
BASOS ABS: 0 10*3/uL (ref 0.0–0.1)
BASOS PCT: 0 % (ref 0–1)
EOS ABS: 0.2 10*3/uL (ref 0.0–0.7)
Eosinophils Relative: 1 % (ref 0–5)
HCT: 33.3 % — ABNORMAL LOW (ref 39.0–52.0)
Hemoglobin: 11.4 g/dL — ABNORMAL LOW (ref 13.0–17.0)
LYMPHS PCT: 13 % (ref 12–46)
Lymphs Abs: 2 10*3/uL (ref 0.7–4.0)
MCH: 28.1 pg (ref 26.0–34.0)
MCHC: 34.2 g/dL (ref 30.0–36.0)
MCV: 82.2 fL (ref 78.0–100.0)
MONO ABS: 0.6 10*3/uL (ref 0.1–1.0)
MONOS PCT: 4 % (ref 3–12)
MPV: 9.1 fL (ref 8.6–12.4)
NEUTROS PCT: 82 % — AB (ref 43–77)
Neutro Abs: 12.5 10*3/uL — ABNORMAL HIGH (ref 1.7–7.7)
PLATELETS: 311 10*3/uL (ref 150–400)
RBC: 4.05 MIL/uL — AB (ref 4.22–5.81)
RDW: 15 % (ref 11.5–15.5)
WBC: 15.2 10*3/uL — ABNORMAL HIGH (ref 4.0–10.5)

## 2014-10-29 LAB — T-HELPER CELL (CD4) - (RCID CLINIC ONLY)
CD4 T CELL ABS: 290 /uL — AB (ref 400–2700)
CD4 T CELL HELPER: 15 % — AB (ref 33–55)

## 2014-10-30 LAB — HIV-1 RNA QUANT-NO REFLEX-BLD
HIV 1 RNA QUANT: 103 {copies}/mL — AB (ref <20)
HIV-1 RNA Quant, Log: 2.01 {Log} — ABNORMAL HIGH (ref <1.30)

## 2014-11-09 ENCOUNTER — Encounter: Payer: Self-pay | Admitting: Internal Medicine

## 2014-11-09 ENCOUNTER — Ambulatory Visit (INDEPENDENT_AMBULATORY_CARE_PROVIDER_SITE_OTHER): Payer: 59 | Admitting: Internal Medicine

## 2014-11-09 VITALS — BP 122/79 | HR 74 | Temp 97.5°F | Wt 148.0 lb

## 2014-11-09 DIAGNOSIS — B20 Human immunodeficiency virus [HIV] disease: Secondary | ICD-10-CM | POA: Diagnosis not present

## 2014-11-09 MED ORDER — DOLUTEGRAVIR SODIUM 50 MG PO TABS: 50.0000 mg | ORAL_TABLET | Freq: Every day | ORAL | Status: DC

## 2014-11-09 MED ORDER — SULFAMETHOXAZOLE-TRIMETHOPRIM 800-160 MG PO TABS: 1.0000 | ORAL_TABLET | Freq: Every day | ORAL | Status: DC

## 2014-11-09 MED ORDER — EMTRICITABINE-TENOFOVIR DF 200-300 MG PO TABS: 1.0000 | ORAL_TABLET | Freq: Every day | ORAL | Status: DC

## 2014-11-09 NOTE — Progress Notes (Signed)
   Subjective:    Patient ID: Steven Collins, male    DOB: 1965-03-29, 49 y.o.   MRN: [id number]  HPI He comes in for follow up of HIV. He was last seen in 2012 then again last month to reestablish care.  Had not been in any other previous regimens.  Stopped follow-up and stopped taking his medications after that time.  He had been doing well according to him until earlier this year when he began to have pneumonia. He had what was initially diagnosed as bronchitis and then was admitted to [facility] for pneumonia and treated for community-acquired pneumonia but did not the Hospital of's his diagnosis of HIV. He then was discharged but then returned to the hospital at [facility] and was diagnosed with PCP pneumonia. He tells me his CD4 count was 37 there and he was treated for PCP pneumonia and supposed to start in his antiretroviral therapy though did not ever fill his prescription. He comes in here to reestablish care. He also recently had thrush and did clear up though is back again today. He has trouble swallowing. He is here with his fiance who was partner tested. He is interested in getting back on therapy.   Review of Systems  Constitutional: Positive for unexpected weight change. Negative for activity change and fatigue.  HENT: Positive for sore throat and trouble swallowing.   Eyes: Negative for visual disturbance.  Gastrointestinal: Negative for diarrhea and constipation.  Skin: Negative for rash.  Neurological: Negative for dizziness, light-headedness and headaches.  Psychiatric/Behavioral: Negative for sleep disturbance.       Objective:   Physical Exam  Constitutional: He appears well-developed and well-nourished. No distress.  HENT:  poor dentition  Eyes: Right eye exhibits no discharge. Left eye exhibits no discharge. No scleral icterus.  Cardiovascular: Normal rate, regular rhythm and normal heart sounds.   No murmur heard. Pulmonary/Chest: Effort normal.  No respiratory distress. He has no wheezes.  Lymphadenopathy:    He has no cervical adenopathy.  Skin: No rash noted.          Assessment & Plan:

## 2014-12-03 ENCOUNTER — Other Ambulatory Visit: Payer: Self-pay

## 2014-12-17 ENCOUNTER — Ambulatory Visit (INDEPENDENT_AMBULATORY_CARE_PROVIDER_SITE_OTHER): Payer: 59 | Admitting: Internal Medicine

## 2014-12-17 ENCOUNTER — Encounter: Payer: Self-pay | Admitting: Internal Medicine

## 2014-12-17 VITALS — BP 149/87 | HR 89 | Temp 98.1°F | Ht 65.0 in | Wt 151.0 lb

## 2014-12-17 DIAGNOSIS — R634 Abnormal weight loss: Secondary | ICD-10-CM

## 2014-12-17 DIAGNOSIS — B20 Human immunodeficiency virus [HIV] disease: Secondary | ICD-10-CM

## 2014-12-17 MED ORDER — EMTRICITABINE-TENOFOVIR DF 200-300 MG PO TABS: 1.0000 | ORAL_TABLET | Freq: Every day | ORAL | Status: DC

## 2014-12-17 NOTE — Assessment & Plan Note (Signed)
Stable now on his HIV meds.

## 2014-12-17 NOTE — Addendum Note (Signed)
Addended by: Andree Coss on: 12/17/2014 12:03 PM   Modules accepted: Medications

## 2014-12-17 NOTE — Assessment & Plan Note (Signed)
Mutations noted and will streamline to Tivicay and Truvada.  Pharmacy was called to confirm new prescriptions.  Labs today and rtc 3 months.

## 2014-12-17 NOTE — Progress Notes (Signed)
   Subjective:    Patient ID: Steven Collins, male    DOB: [birth year], 49 y.o.   MRN: [id number]  HPI He comes in for follow up of HIV. He was last seen in 2012 and reestablished care in July.  He was to start [place] but denied by his insurance and started on Complera then noted resistance and added Tivicay.  His CD4 was 50 but after starting was up to 290 and viral load down to 103.  No missed doses.  Feels better.  No weight loss, no diarrhea.     Review of Systems  Constitutional: Negative for activity change and fatigue.  Eyes: Negative for visual disturbance.  Gastrointestinal: Negative for diarrhea and constipation.  Skin: Negative for rash.  Neurological: Negative for dizziness, light-headedness and headaches.  Psychiatric/Behavioral: Negative for sleep disturbance.       Objective:   Physical Exam  Constitutional: He appears well-developed and well-nourished. No distress.  HENT:  poor dentition  Eyes: Right eye exhibits no discharge. Left eye exhibits no discharge. No scleral icterus.  Cardiovascular: Normal rate, regular rhythm and normal heart sounds.   No murmur heard. Pulmonary/Chest: Effort normal. No respiratory distress. He has no wheezes.  Lymphadenopathy:    He has no cervical adenopathy.  Skin: No rash noted.          Assessment & Plan:

## 2014-12-18 LAB — HIV-1 RNA QUANT-NO REFLEX-BLD
HIV 1 RNA Quant: <20 copies/mL (ref <20)
HIV-1 RNA Quant, Log: <1.3 {Log} (ref <1.30)

## 2014-12-18 LAB — T-HELPER CELL (CD4) - (RCID CLINIC ONLY)
CD4 T CELL HELPER: 23 % — AB (ref 33–55)
CD4 T Cell Abs: 420 /uL (ref 400–2700)

## 2015-03-23 ENCOUNTER — Other Ambulatory Visit: Payer: Self-pay | Admitting: Pharmacist Clinician (PhC)/ Clinical Pharmacy Specialist

## 2015-03-23 ENCOUNTER — Encounter: Payer: Self-pay | Admitting: Internal Medicine

## 2015-03-23 ENCOUNTER — Ambulatory Visit (INDEPENDENT_AMBULATORY_CARE_PROVIDER_SITE_OTHER): Payer: 59 | Admitting: Internal Medicine

## 2015-03-23 VITALS — BP 150/86 | HR 80 | Temp 98.2°F | Ht 65.0 in | Wt 165.8 lb

## 2015-03-23 DIAGNOSIS — Z720 Tobacco use: Secondary | ICD-10-CM | POA: Diagnosis not present

## 2015-03-23 DIAGNOSIS — B20 Human immunodeficiency virus [HIV] disease: Secondary | ICD-10-CM

## 2015-03-23 MED ORDER — DOLUTEGRAVIR SODIUM 50 MG PO TABS: 50.0000 mg | ORAL_TABLET | Freq: Every day | ORAL | Status: DC

## 2015-03-23 MED ORDER — EMTRICITABINE-TENOFOVIR DF 200-300 MG PO TABS: 1.0000 | ORAL_TABLET | Freq: Every day | ORAL | Status: DC

## 2015-03-23 NOTE — Assessment & Plan Note (Signed)
Discussed cessation 

## 2015-03-23 NOTE — Assessment & Plan Note (Signed)
Doing well on salvage regimen.  Labs today and rtc 4 months unless concerns.  Will have our pharmacy tech work with patient.

## 2015-03-23 NOTE — Progress Notes (Signed)
   Subjective:    Patient ID: Steven Collins, male    DOB: 04-23-1965, 50 y.o.   MRN: [id number]  HPI He comes in for follow up of HIV.   He was previously seen in 2012 and reestablished care in July 2016.  He was to start [place] but denied by his insurance and started on Complera then noted resistance and added Tivicay.  His CD4 was 50 but after starting was up to 290 and viral load down to 103.  Last visit CD4 of 420 and viral load undetectable.  Changed to Tivicay and Truvada.  He misses some doses when he runs out and can't get it on time.  No weight loss, no diarrhea.  Also told he has a 2900$ copay (? Deductible).     Review of Systems  Constitutional: Negative for activity change and fatigue.  Eyes: Negative for visual disturbance.  Gastrointestinal: Negative for diarrhea and constipation.  Skin: Negative for rash.  Neurological: Negative for dizziness, light-headedness and headaches.  Psychiatric/Behavioral: Negative for sleep disturbance.       Objective:   Physical Exam  Constitutional: He appears well-developed and well-nourished. No distress.  HENT:  poor dentition  Eyes: Right eye exhibits no discharge. Left eye exhibits no discharge. No scleral icterus.  Cardiovascular: Normal rate, regular rhythm and normal heart sounds.   No murmur heard. Pulmonary/Chest: Effort normal. No respiratory distress. He has no wheezes.  Lymphadenopathy:    He has no cervical adenopathy.  Skin: No rash noted.    Social History   Social History  . Marital Status: Single    Spouse Name: N/A  . Number of Children: N/A  . Years of Education: N/A   Occupational History  . Not on file.   Social History Main Topics  . Smoking status: Current Every Day Smoker -- 1.00 packs/day    Types: Cigarettes  . Smokeless tobacco: Never Used  . Alcohol Use: No  . 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 Sexual Activity:    Partners: Female   Other Topics Concern  . Not on file   Social  History Narrative        Assessment & Plan:

## 2015-03-25 LAB — T-HELPER CELL (CD4) - (RCID CLINIC ONLY)
CD4 T CELL HELPER: 21 % — AB (ref 33–55)
CD4 T Cell Abs: 530 /uL (ref 400–2700)

## 2015-03-26 LAB — HIV-1 RNA QUANT-NO REFLEX-BLD
HIV 1 RNA Quant: 82 copies/mL — ABNORMAL HIGH (ref <20)
HIV-1 RNA Quant, Log: 1.91 Log copies/mL — ABNORMAL HIGH (ref <1.30)

## 2015-05-10 ENCOUNTER — Other Ambulatory Visit: Payer: Self-pay | Admitting: *Deleted

## 2015-05-10 MED ORDER — EMTRICITABINE-TENOFOVIR DF 200-300 MG PO TABS: 1.0000 | ORAL_TABLET | Freq: Every day | ORAL | Status: DC

## 2015-05-10 MED ORDER — DOLUTEGRAVIR SODIUM 50 MG PO TABS: 50.0000 mg | ORAL_TABLET | Freq: Every day | ORAL | Status: DC

## 2015-07-22 ENCOUNTER — Encounter: Payer: Self-pay | Admitting: Internal Medicine

## 2015-07-22 ENCOUNTER — Ambulatory Visit (INDEPENDENT_AMBULATORY_CARE_PROVIDER_SITE_OTHER): Payer: 59 | Admitting: Internal Medicine

## 2015-07-22 VITALS — BP 156/83 | HR 74 | Temp 98.1°F | Wt 176.0 lb

## 2015-07-22 DIAGNOSIS — Z79899 Other long term (current) drug therapy: Secondary | ICD-10-CM | POA: Diagnosis not present

## 2015-07-22 DIAGNOSIS — Z113 Encounter for screening for infections with a predominantly sexual mode of transmission: Secondary | ICD-10-CM | POA: Diagnosis not present

## 2015-07-22 DIAGNOSIS — Z720 Tobacco use: Secondary | ICD-10-CM | POA: Diagnosis not present

## 2015-07-22 DIAGNOSIS — B20 Human immunodeficiency virus [HIV] disease: Secondary | ICD-10-CM | POA: Diagnosis not present

## 2015-07-22 LAB — COMPLETE METABOLIC PANEL WITH GFR
ALBUMIN: 3.6 g/dL (ref 3.6–5.1)
ALK PHOS: 78 U/L (ref 40–115)
ALT: 6 U/L — ABNORMAL LOW (ref 9–46)
AST: 9 U/L — ABNORMAL LOW (ref 10–35)
BUN: 13 mg/dL (ref 7–25)
CO2: 25 mmol/L (ref 20–31)
Calcium: 9 mg/dL (ref 8.6–10.3)
Chloride: 101 mmol/L (ref 98–110)
Creat: 0.77 mg/dL (ref 0.70–1.33)
GLUCOSE: 104 mg/dL — AB (ref 65–99)
POTASSIUM: 4.7 mmol/L (ref 3.5–5.3)
SODIUM: 135 mmol/L (ref 135–146)
Total Bilirubin: 0.2 mg/dL (ref 0.2–1.2)
Total Protein: 6.9 g/dL (ref 6.1–8.1)

## 2015-07-22 LAB — CBC WITH DIFFERENTIAL/PLATELET
BASOS ABS: 97 {cells}/uL (ref 0–200)
BASOS PCT: 1 %
EOS PCT: 3 %
Eosinophils Absolute: 291 cells/uL (ref 15–500)
HCT: 37.4 % — ABNORMAL LOW (ref 38.5–50.0)
HEMOGLOBIN: 12.6 g/dL — AB (ref 13.2–17.1)
LYMPHS ABS: 2716 {cells}/uL (ref 850–3900)
Lymphocytes Relative: 28 %
MCH: 28.8 pg (ref 27.0–33.0)
MCHC: 33.7 g/dL (ref 32.0–36.0)
MCV: 85.4 fL (ref 80.0–100.0)
MPV: 8.5 fL (ref 7.5–12.5)
Monocytes Absolute: 970 cells/uL — ABNORMAL HIGH (ref 200–950)
Monocytes Relative: 10 %
NEUTROS ABS: 5626 {cells}/uL (ref 1500–7800)
Neutrophils Relative %: 58 %
PLATELETS: 369 10*3/uL (ref 140–400)
RBC: 4.38 MIL/uL (ref 4.20–5.80)
RDW: 14.6 % (ref 11.0–15.0)
WBC: 9.7 10*3/uL (ref 3.8–10.8)

## 2015-07-22 LAB — LIPID PANEL
CHOL/HDL RATIO: 6.1 ratio — AB (ref <=5.0)
CHOLESTEROL: 135 mg/dL (ref 125–200)
HDL: 22 mg/dL — AB (ref >=40)
LDL Cholesterol: 80 mg/dL (ref <130)
TRIGLYCERIDES: 163 mg/dL — AB (ref <150)
VLDL: 33 mg/dL — AB (ref <30)

## 2015-07-23 LAB — RPR

## 2015-07-23 NOTE — Assessment & Plan Note (Addendum)
Doing well but I am concerned with him running out of medication prior to receiving the next month.  I have again asked him to call much sooner to get the refill sent.   Labs today and RTC 4 months.  I also recommended he get a PCP

## 2015-07-23 NOTE — Assessment & Plan Note (Signed)
Encouraged cessation.

## 2015-07-23 NOTE — Progress Notes (Signed)
   Subjective:    Patient ID: Steven Collins, male    DOB: 18-May-1965, 50 y.o.   MRN: [id number]  HPI He comes in for follow up of HIV.   He was previously seen in 2012 and reestablished care in July 2016.  He was to start [place] but denied by his insurance and started on Complera then noted resistance and added Tivicay.  His CD4 was 50 but after starting was up to 290 and viral load down to 103.  Last visit CD4 of 420 and viral load undetectable.  Changed to Tivicay and Truvada.  He misses some doses when he runs out and can't get it on time, same as previous problem.  No weight loss, no diarrhea.  Complains of pain and asking for pain medication.    Review of Systems  Constitutional: Negative for activity change and fatigue.  Eyes: Negative for visual disturbance.  Gastrointestinal: Negative for diarrhea and constipation.  Skin: Negative for rash.  Neurological: Negative for dizziness, light-headedness and headaches.  Psychiatric/Behavioral: Negative for sleep disturbance.       Objective:   Physical Exam  Constitutional: He appears well-developed and well-nourished. No distress.  HENT:  poor dentition  Eyes: Right eye exhibits no discharge. Left eye exhibits no discharge. No scleral icterus.  Cardiovascular: Normal rate, regular rhythm and normal heart sounds.   No murmur heard. Pulmonary/Chest: Effort normal. No respiratory distress. He has no wheezes.  Lymphadenopathy:    He has no cervical adenopathy.  Skin: No rash noted.    Social History   Social History  . Marital Status: Single    Spouse Name: N/A  . Number of Children: N/A  . Years of Education: N/A   Occupational History  . Not on file.   Social History Main Topics  . Smoking status: Current Every Day Smoker -- 1.00 packs/day    Types: Cigarettes  . Smokeless tobacco: Never Used  . Alcohol Use: No  . 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 Sexual Activity:    Partners: Female   Other Topics Concern   . Not on file   Social History Narrative        Assessment & Plan:

## 2015-07-26 LAB — HIV-1 RNA QUANT-NO REFLEX-BLD: HIV 1 RNA Quant: <20 copies/mL (ref <20)

## 2015-07-26 LAB — URINE CYTOLOGY ANCILLARY ONLY
CHLAMYDIA, DNA PROBE: NEGATIVE
Neisseria Gonorrhea: NEGATIVE

## 2015-08-23 ENCOUNTER — Other Ambulatory Visit: Payer: Self-pay | Admitting: *Deleted

## 2015-08-23 DIAGNOSIS — B20 Human immunodeficiency virus [HIV] disease: Secondary | ICD-10-CM

## 2015-08-23 MED ORDER — EMTRICITABINE-TENOFOVIR DF 200-300 MG PO TABS: 1.0000 | ORAL_TABLET | Freq: Every day | ORAL | Status: DC

## 2015-08-23 MED ORDER — DOLUTEGRAVIR SODIUM 50 MG PO TABS: 50.0000 mg | ORAL_TABLET | Freq: Every day | ORAL | Status: DC

## 2015-09-08 ENCOUNTER — Other Ambulatory Visit: Payer: Self-pay | Admitting: *Deleted

## 2015-09-08 DIAGNOSIS — B20 Human immunodeficiency virus [HIV] disease: Secondary | ICD-10-CM

## 2015-09-08 MED ORDER — EMTRICITABINE-TENOFOVIR DF 200-300 MG PO TABS: 1.0000 | ORAL_TABLET | Freq: Every day | ORAL | Status: DC

## 2015-09-08 MED ORDER — DOLUTEGRAVIR SODIUM 50 MG PO TABS: 50.0000 mg | ORAL_TABLET | Freq: Every day | ORAL | Status: DC

## 2015-11-23 ENCOUNTER — Ambulatory Visit (INDEPENDENT_AMBULATORY_CARE_PROVIDER_SITE_OTHER): Payer: 59 | Admitting: Internal Medicine

## 2015-11-23 ENCOUNTER — Encounter: Payer: Self-pay | Admitting: Internal Medicine

## 2015-11-23 VITALS — BP 148/80 | HR 87 | Temp 98.2°F | Wt 182.0 lb

## 2015-11-23 DIAGNOSIS — B20 Human immunodeficiency virus [HIV] disease: Secondary | ICD-10-CM | POA: Diagnosis not present

## 2015-11-23 DIAGNOSIS — Z23 Encounter for immunization: Secondary | ICD-10-CM

## 2015-11-23 DIAGNOSIS — Z720 Tobacco use: Secondary | ICD-10-CM | POA: Diagnosis not present

## 2015-11-23 NOTE — Progress Notes (Signed)
   Subjective:    Patient ID: Steven DownsJames W Maulding, male    DOB: [birth year], 50 y.o.   MRN: [id number]  HPI He comes in for follow up of HIV.   He was previously seen in 2012 and reestablished care in July 2016.  He was to start [place] but denied by his insurance and started on Complera then noted resistance and added Tivicay, then changed to Tivicay and Truvada.  He previously was running out of medication when not getting it on time but now knows to call ahead and get filled early.  No weight loss, no diarrhea, has in fact gained weight. Continues to smoke.    Review of Systems  Constitutional: Negative for activity change and fatigue.  Eyes: Negative for visual disturbance.  Gastrointestinal: Negative for constipation and diarrhea.  Skin: Negative for rash.  Neurological: Negative for dizziness, light-headedness and headaches.  Psychiatric/Behavioral: Negative for sleep disturbance.       Objective:   Physical Exam  Constitutional: He appears well-developed and well-nourished. No distress.  HENT:  poor dentition  Eyes: Right eye exhibits no discharge. Left eye exhibits no discharge. No scleral icterus.  Cardiovascular: Normal rate, regular rhythm and normal heart sounds.   No murmur heard. Pulmonary/Chest: Effort normal. No respiratory distress. He has no wheezes.  Lymphadenopathy:    He has no cervical adenopathy.  Skin: No rash noted.    Social History   Social History  . Marital status: Single    Spouse name: N/A  . Number of children: N/A  . Years of education: N/A   Occupational History  . Not on file.   Social History Main Topics  . Smoking status: Current Every Day Smoker    Packs/day: 1.00    Types: Cigarettes  . Smokeless tobacco: Never Used  . Alcohol use No  . 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 Sexual activity: Yes    Partners: Female   Other Topics Concern  . Not on file   Social History Narrative  . No narrative on file          Assessment & Plan:

## 2015-11-23 NOTE — Assessment & Plan Note (Signed)
Doing well.  Labs today and rtc 6 months unless concerns.  

## 2015-11-23 NOTE — Assessment & Plan Note (Signed)
Counseled again on cessation. 

## 2015-11-24 LAB — T-HELPER CELL (CD4) - (RCID CLINIC ONLY)
CD4 % Helper T Cell: 22 % — ABNORMAL LOW (ref 33–55)
CD4 T CELL ABS: 660 /uL (ref 400–2700)

## 2015-11-25 LAB — HIV-1 RNA QUANT-NO REFLEX-BLD
HIV 1 RNA Quant: 20 copies/mL (ref <20)
HIV-1 RNA QUANT, LOG: 1.3 {Log_copies}/mL (ref <1.30)

## 2016-02-29 ENCOUNTER — Other Ambulatory Visit: Payer: Self-pay | Admitting: *Deleted

## 2016-02-29 DIAGNOSIS — B20 Human immunodeficiency virus [HIV] disease: Secondary | ICD-10-CM

## 2016-02-29 MED ORDER — DOLUTEGRAVIR SODIUM 50 MG PO TABS: 50.0000 mg | ORAL_TABLET | Freq: Every day | ORAL | 1 refills | Status: DC

## 2016-02-29 MED ORDER — EMTRICITABINE-TENOFOVIR DF 200-300 MG PO TABS: 1.0000 | ORAL_TABLET | Freq: Every day | ORAL | 1 refills | Status: DC

## 2016-06-05 ENCOUNTER — Ambulatory Visit: Payer: 59 | Admitting: Internal Medicine

## 2016-06-27 ENCOUNTER — Ambulatory Visit (INDEPENDENT_AMBULATORY_CARE_PROVIDER_SITE_OTHER): Payer: 59 | Admitting: Internal Medicine

## 2016-06-27 ENCOUNTER — Encounter: Payer: Self-pay | Admitting: Internal Medicine

## 2016-06-27 ENCOUNTER — Other Ambulatory Visit: Payer: Self-pay | Admitting: Internal Medicine

## 2016-06-27 VITALS — BP 144/84 | HR 73 | Temp 97.7°F | Wt 200.0 lb

## 2016-06-27 DIAGNOSIS — Z720 Tobacco use: Secondary | ICD-10-CM | POA: Diagnosis not present

## 2016-06-27 DIAGNOSIS — B20 Human immunodeficiency virus [HIV] disease: Secondary | ICD-10-CM | POA: Diagnosis not present

## 2016-06-27 DIAGNOSIS — R110 Nausea: Secondary | ICD-10-CM | POA: Diagnosis not present

## 2016-06-27 MED ORDER — ONDANSETRON HCL 4 MG PO TABS: 4.0000 mg | ORAL_TABLET | Freq: Three times a day (TID) | ORAL | 2 refills | Status: DC | PRN

## 2016-06-27 NOTE — Assessment & Plan Note (Signed)
Will try Zofran for his nausea.

## 2016-06-27 NOTE — Progress Notes (Signed)
   Subjective:    Patient ID: Steven Collins, male    DOB: 05-15-1965, 51 y.o.   MRN: [id number]  HPI Here for follow up of HIV.  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  He has been taking well with last CD4 in September 2017 of 660 with a suppressed viral load.  Unfortunately he has had difficulty getting his medication covered with his insurance for unclear reasons so opted to start taking his ARVs every other day for about the last 3 weeks.  He did not call or let us assist him.  He also has had issues with nausea since taking his ARVs intermittently.  No weight loss, no associated diarrhea.     Review of Systems  Constitutional: Negative for activity change, appetite change and fatigue.  Gastrointestinal: Positive for nausea. Negative for diarrhea and vomiting.  Skin: Negative for rash.  Neurological: Negative for dizziness.       Objective:   Physical Exam  Constitutional: He appears well-developed and well-nourished. No distress.  Eyes: No scleral icterus.  Cardiovascular: Normal rate, regular rhythm and normal heart sounds.   Pulmonary/Chest: Effort normal and breath sounds normal. No respiratory distress.  Skin: No rash noted.    SH: + tobacco      Assessment & Plan:

## 2016-06-27 NOTE — Assessment & Plan Note (Signed)
conseled on cessation

## 2016-06-27 NOTE — Assessment & Plan Note (Signed)
I will have him stop the ARVs, do a genotype and integrase geno and get him back in 2 weeks to consider a new regimen with PharmD.  Hopefully has not developed another mutation.   'He was counseled extensively on communication with [place] for any issues that arise, the available resources here and the need to take ARVs daily or not at all.

## 2016-06-28 LAB — T-HELPER CELL (CD4) - (RCID CLINIC ONLY)
CD4 T CELL ABS: 540 /uL (ref 400–2700)
CD4 T CELL HELPER: 21 % — AB (ref 33–55)

## 2016-06-29 LAB — HIV-1 RNA,QN PCR W/REFLEX GENOTYPE

## 2016-07-04 LAB — HIV-1 INTEGRASE GENOTYPE

## 2016-07-12 ENCOUNTER — Ambulatory Visit (INDEPENDENT_AMBULATORY_CARE_PROVIDER_SITE_OTHER): Payer: 59 | Admitting: Pharmacist Clinician (PhC)/ Clinical Pharmacy Specialist

## 2016-07-12 ENCOUNTER — Other Ambulatory Visit: Payer: Self-pay | Admitting: Pharmacist Clinician (PhC)/ Clinical Pharmacy Specialist

## 2016-07-12 DIAGNOSIS — B20 Human immunodeficiency virus [HIV] disease: Secondary | ICD-10-CM

## 2016-07-12 MED ORDER — BICTEGRAVIR-EMTRICITAB-TENOFOV 50-200-25 MG PO TABS: 1.0000 | ORAL_TABLET | Freq: Every day | ORAL | 6 refills | Status: DC

## 2016-07-12 MED ORDER — DOLUTEGRAVIR SODIUM 50 MG PO TABS: 50.0000 mg | ORAL_TABLET | Freq: Every day | ORAL | 3 refills | Status: DC

## 2016-07-12 MED ORDER — EMTRICITABINE-TENOFOVIR AF 200-25 MG PO TABS: 1.0000 | ORAL_TABLET | Freq: Every day | ORAL | 3 refills | Status: DC

## 2016-07-12 NOTE — Progress Notes (Signed)
HPI: DAETON KLUTH is a 51 y.o. male who recently saw Dr. Luciana Axe for his HIV f/u.   Allergies: No Known Allergies  Vitals:    Past Medical History: No past medical history on file.  Social History: Social History   Social History  . Marital status: Single    Spouse name: N/A  . Number of children: N/A  . Years of education: N/A   Social History Main Topics  . Smoking status: Current Every Day Smoker    Packs/day: 1.00    Types: Cigarettes  . Smokeless tobacco: Never Used  . Alcohol use No  . 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 Sexual activity: Yes    Partners: Female   Other Topics Concern  . Not on file   Social History Narrative  . No narrative on file    Previous Regimen: ATP, DTG/Descovy  Current Regimen: Off  Labs: HIV 1 RNA Quant (copies/mL)  Date Value  11/23/2015 20  07/22/2015 <20  03/23/2015 82 (H)   CD4 T Cell Abs (/uL)  Date Value  06/27/2016 540  11/23/2015 660  03/23/2015 530   Hep B S Ab (no units)  Date Value  05/14/2006 NO   Hepatitis B Surface Ag (no units)  Date Value  05/14/2006 NO   HCV Ab (no units)  Date Value  05/14/2006 NO    CrCl: CrCl cannot be calculated (Patient's most recent lab result is older than the maximum 21 days allowed.).  Lipids:    Component Value Date/Time   CHOL 135 07/22/2015 1612   TRIG 163 (H) 07/22/2015 1612   HDL 22 (L) 07/22/2015 1612   CHOLHDL 6.1 (H) 07/22/2015 1612   VLDL 33 (H) 07/22/2015 1612   LDLCALC 80 07/22/2015 1612   HIV Genotype Composite Data Genotype Dates:   Mutations in Bold impact drug susceptibility RT Mutations K103N, V108I, Y181C, P225H  PI Mutations L74I  Integrase Mutations None   Interpretation of Genotype Data per [facility] HIV Database Nucleoside RTIs  abacavir (ABC) Susceptible zidovudine (AZT) Susceptible emtricitabine (FTC) Susceptible lamivudine (3TC) Susceptible tenofovir (TDF) Susceptible   Non-Nucleoside RTIs  efavirenz  (EFV) High-Level Resistance etravirine (ETR) Intermediate Resistance nevirapine (NVP) High-Level Resistance rilpivirine (RPV) Intermediate Resistance   Protease Inhibitors  atazanavir/r (ATV/r) Susceptible darunavir/r (DRV/r) Susceptible lopinavir/r (LPV/r) Susceptible   Integrase Inhibitors     Assessment: Fayrene Fearing saw Dr. Luciana Axe about 2 wks ago for his HIV visit. He let us know that the pharmacy said that he has some significant copay each month. [facility] will never activate any copay cards. He was spacing his ART out to every other day for about 2 wks to save money. Dr. Luciana Axe told him to stop it at that time due to fear of resistance and a genotype was sent. Counseled him to never space out is meds like that. I showed him his resistance he had developed. However, his VL is still undetectable so they couldn't do a genotype. I suspected that his VL is up over 2000 now so I'm going ot redraw the VL with reflex today. I was planning on sending Biktarvy to [facility] pharmacy but it's not on formulary yet and his insurance requires [facility]. We are going to put him back on DTG then change Truvada to Descovy. Both have been sent to the specialty pharmacy. Activated both copay cards for him and handed them to him. He is going to call [facility] to give them the info. He is going  to come back in 1 month to see me to f/u on his VL.  Recommendations:  Repeat HIV VL with reflex today Restart DTG  PO qday Change Truvada to Descovy 1 PO qday Activate copay cards F/u with me in 1 month  Ulyses Southward, PharmD, BCPS, AAHIVP, CPP Clinical Infectious Disease Pharmacist [facility] 07/12/2016, 9:50 AM

## 2016-07-12 NOTE — Patient Instructions (Signed)
We will repeat HIV labs today We will see if Biktarvy 1 tablet daily Come back and see me in 1 month

## 2016-07-15 LAB — HIV-1 RNA,QN PCR W/REFLEX GENOTYPE
HIV-1 RNA, QN PCR: 5.79 {Log_copies}/mL — AB
HIV-1 RNA, QN PCR: 611000 Copies/mL — ABNORMAL HIGH

## 2016-07-20 ENCOUNTER — Telehealth: Payer: Self-pay | Admitting: Pharmacist Clinician (PhC)/ Clinical Pharmacy Specialist

## 2016-07-20 LAB — HIV-1 INTEGRASE GENOTYPE

## 2016-07-20 LAB — HIV-1 GENOTYPR PLUS

## 2016-07-20 NOTE — Telephone Encounter (Signed)
HIV Genotype Composite Data Genotype Dates:   Mutations in Bold impact drug susceptibility RT Mutations L74I, M184V, K103N, V108I, Y181C, P225H  PI Mutations None  Integrase Mutations None   Interpretation of Genotype Data per Stanford HIV Database Nucleoside RTIs  abacavir (ABC) High-Level Resistance zidovudine (AZT) Susceptible emtricitabine (FTC) High-Level Resistance lamivudine (3TC) High-Level Resistance tenofovir (TDF) Susceptible   Non-Nucleoside RTIs  efavirenz (EFV) High-Level Resistance etravirine (ETR) Intermediate Resistance nevirapine (NVP) High-Level Resistance rilpivirine (RPV) Intermediate Resistance   Protease Inhibitors  atazanavir/r (ATV/r) Susceptible darunavir/r (DRV/r) Susceptible lopinavir/r (LPV/r) Susceptible   Integrase Inhibitors  bictegravir (BIC) Susceptible dolutegravir (DTG) Susceptible elvitegravir (EVG) Susceptible raltegravir (RAL) Susceptible

## 2016-07-24 ENCOUNTER — Encounter: Payer: Self-pay | Admitting: Pharmacist Clinician (PhC)/ Clinical Pharmacy Specialist

## 2016-08-16 ENCOUNTER — Ambulatory Visit (INDEPENDENT_AMBULATORY_CARE_PROVIDER_SITE_OTHER): Payer: 59 | Admitting: Pharmacist Clinician (PhC)/ Clinical Pharmacy Specialist

## 2016-08-16 DIAGNOSIS — Z23 Encounter for immunization: Secondary | ICD-10-CM | POA: Diagnosis not present

## 2016-08-16 DIAGNOSIS — B20 Human immunodeficiency virus [HIV] disease: Secondary | ICD-10-CM

## 2016-08-16 NOTE — Progress Notes (Signed)
HPI: Steven Collins is a 51 y.o. male who is here to f/u with pharmacy about his ART.   Allergies: No Known Allergies  Vitals:    Past Medical History: No past medical history on file.  Social History: Social History   Social History  . Marital status: Single    Spouse name: N/A  . Number of children: N/A  . Years of education: N/A   Social History Main Topics  . Smoking status: Current Every Day Smoker    Packs/day: 1.00    Types: Cigarettes  . Smokeless tobacco: Never Used  . Alcohol use No  . 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 Sexual activity: Yes    Partners: Female   Other Topics Concern  . Not on file   Social History Narrative  . No narrative on file    Previous Regimen: ATP  Current Regimen: DTG/Descovy  Labs: HIV 1 RNA Quant (copies/mL)  Date Value  11/23/2015 20  07/22/2015 <20  03/23/2015 82 (H)   CD4 T Cell Abs (/uL)  Date Value  06/27/2016 540  11/23/2015 660  03/23/2015 530   Hep B S Ab (no units)  Date Value  05/14/2006 NO   Hepatitis B Surface Ag (no units)  Date Value  05/14/2006 NO   HCV Ab (no units)  Date Value  05/14/2006 NO    CrCl: CrCl cannot be calculated (Patient's most recent lab result is older than the maximum 21 days allowed.).  Lipids:    Component Value Date/Time   CHOL 135 07/22/2015 1612   TRIG 163 (H) 07/22/2015 1612   HDL 22 (L) 07/22/2015 1612   CHOLHDL 6.1 (H) 07/22/2015 1612   VLDL 33 (H) 07/22/2015 1612   LDLCALC 80 07/22/2015 1612   HIV Genotype Composite Data Genotype Dates:   Mutations in Bold impact drug susceptibility RT Mutations L74I, M184V, K103N, V108I, Y181C, P225H  PI Mutations None  Integrase Mutations None   Interpretation of Genotype Data per Stanford HIV Database Nucleoside RTIs  abacavir (ABC) High-Level Resistance zidovudine (AZT) Susceptible emtricitabine (FTC) High-Level Resistance lamivudine (3TC) High-Level Resistance tenofovir (TDF) Susceptible    Non-Nucleoside RTIs  efavirenz (EFV) High-Level Resistance etravirine (ETR) Intermediate Resistance nevirapine (NVP) High-Level Resistance rilpivirine (RPV) Intermediate Resistance   Protease Inhibitors  atazanavir/r (ATV/r) Susceptible darunavir/r (DRV/r) Susceptible lopinavir/r (LPV/r) Susceptible   Integrase Inhibitors  None    Assessment:  Steven Collins saw me about a month ago after some adherence issue with his ART due to copay issue. He then spaced out his ART. 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 He has picked up m184v now. He has restarted back on Tivicay and Descovy about a month ago. Fortunately, Tivicay is powerful enough that it can handle the m184v. Case was discussed with Dr. Luciana Axeomer about continue his current regimen or add on prezcobix. We both feel comfortable enough with continuing his currently therapy. Strongly advised him to never do that again. Told him that if he is going to stop it, he has to do it completely. We are going to repeat his VL today along with his heb A/B titer.  .  Recommendations:  Continue Tivicay and Descovy HIV labs today, hep A/B ab Menveo #1 Come back in 2 months for the second Menveo and Labs F/u with Dr. Luciana Axeomer after that  Ulyses SouthwardMinh Pham, PharmD, BCPS, AAHIVP, CPP Clinical Infectious Disease Pharmacist [facility] 08/16/2016, 9:56 AM

## 2016-08-16 NOTE — Patient Instructions (Signed)
Continue your Tivicay and Descovy Come back in 2 months for the second vaccine Come back in September to see Dr. Luciana Axeomer

## 2016-08-17 LAB — T-HELPER CELL (CD4) - (RCID CLINIC ONLY)
CD4 % Helper T Cell: 16 % — ABNORMAL LOW (ref 33–55)
CD4 T Cell Abs: 430 /uL (ref 400–2700)

## 2016-08-17 LAB — HEPATITIS B SURFACE ANTIBODY,QUALITATIVE

## 2016-08-17 LAB — HEPATITIS C ANTIBODY: HCV AB: NEGATIVE

## 2016-08-17 LAB — HEPATITIS A ANTIBODY, TOTAL: HEP A TOTAL AB: NONREACTIVE

## 2016-08-18 LAB — HIV-1 RNA QUANT-NO REFLEX-BLD
HIV 1 RNA Quant: <20 copies/mL
HIV-1 RNA Quant, Log: <1.3 Log copies/mL

## 2016-09-08 ENCOUNTER — Telehealth: Payer: Self-pay | Admitting: Pharmacist Clinician (PhC)/ Clinical Pharmacy Specialist

## 2016-09-08 NOTE — Telephone Encounter (Signed)
[facility]. First, he has maxed out both of the copays for Tivicay and Descovy. His plan has also maxed out for the benefit for the drugs. I confirmed with them that they will not reimburse for the rest of the year. They suggested that he should call either the plan itself to discuss the situation or his employer about changing to another plan.

## 2016-10-16 ENCOUNTER — Ambulatory Visit (INDEPENDENT_AMBULATORY_CARE_PROVIDER_SITE_OTHER): Payer: 59 | Admitting: Pharmacist Clinician (PhC)/ Clinical Pharmacy Specialist

## 2016-10-16 DIAGNOSIS — B20 Human immunodeficiency virus [HIV] disease: Secondary | ICD-10-CM | POA: Diagnosis not present

## 2016-10-16 DIAGNOSIS — Z23 Encounter for immunization: Secondary | ICD-10-CM | POA: Diagnosis not present

## 2016-10-16 NOTE — Progress Notes (Signed)
HPI: Steven DownsJames W Collins is a 51 y.o. male who is here to f/u with pharmacy for his HIV adherence issue.   Allergies: No Known Allergies  Vitals:    Past Medical History: No past medical history on file.  Social History: Social History   Social History  . Marital status: Single    Spouse name: N/A  . Number of children: N/A  . Years of education: N/A   Social History Main Topics  . Smoking status: Current Every Day Smoker    Packs/day: 1.00    Types: Cigarettes  . Smokeless tobacco: Never Used  . Alcohol use No  . 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 Sexual activity: Yes    Partners: Female   Other Topics Concern  . Not on file   Social History Narrative  . No narrative on file    Previous Regimen: ATP  Current Regimen: DTG/Descovy  Labs: HIV 1 RNA Quant (copies/mL)  Date Value  08/16/2016 <20 NOT DETECTED  11/23/2015 20  07/22/2015 <20   CD4 T Cell Abs (/uL)  Date Value  08/16/2016 430  06/27/2016 540  11/23/2015 660   Hep B S Ab (no units)  Date Value  08/16/2016 INDETER (A)   Hepatitis B Surface Ag (no units)  Date Value  05/14/2006 NO   HCV Ab (no units)  Date Value  08/16/2016 NEGATIVE    CrCl: CrCl cannot be calculated (Patient's most recent lab result is older than the maximum 21 days allowed.).  Lipids:    Component Value Date/Time   CHOL 135 07/22/2015 1612   TRIG 163 (H) 07/22/2015 1612   HDL 22 (L) 07/22/2015 1612   CHOLHDL 6.1 (H) 07/22/2015 1612   VLDL 33 (H) 07/22/2015 1612   LDLCALC 80 07/22/2015 1612    Assessment: Steven FearingJames is here for his HIV adherence visit and second Menveo vaccine. He has a lot of issues in the past due to his insurance coverage. His plan would only cover the ART up to a certain amount before the coverage ran out. In the past, he was spacing the out the ART to save money. This is when he developed the complete resistance to NNRTIs. We placed him on DTG/Descovy and he is doing well on it. He was  suppressed in late May. We are going to repeat his labs today.   He has reached out to his work about the insurance issue. They must have set him up through [employer] to cover him for the rest of the year. I'm guessing the this will happen on a year basis. Advised him that he needs to keep track of the process.   Recommendations:  Continue Tivicay and Descovy Second Menveo vaccine F/u with Dr. Luciana Axeomer in Sept Can get hep A series at that time   Steven SouthwardMinh Madeline Collins, PharmD, BCPS, AAHIVP, CPP Clinical Infectious Disease Pharmacist [facility] 10/16/2016, 9:41 AM

## 2016-10-16 NOTE — Patient Instructions (Signed)
Labs today Follow up with Dr. Luciana Axeomer in Sept

## 2016-10-19 LAB — HIV-1 RNA QUANT-NO REFLEX-BLD
HIV 1 RNA QUANT: DETECTED {copies}/mL — AB
HIV-1 RNA QUANT, LOG: DETECTED {Log_copies}/mL — AB

## 2016-11-21 ENCOUNTER — Encounter: Payer: Self-pay | Admitting: Internal Medicine

## 2016-11-21 ENCOUNTER — Ambulatory Visit (INDEPENDENT_AMBULATORY_CARE_PROVIDER_SITE_OTHER): Payer: 59 | Admitting: Internal Medicine

## 2016-11-21 VITALS — BP 121/81 | HR 81 | Temp 98.4°F | Wt 208.0 lb

## 2016-11-21 DIAGNOSIS — Z720 Tobacco use: Secondary | ICD-10-CM | POA: Diagnosis not present

## 2016-11-21 DIAGNOSIS — B20 Human immunodeficiency virus [HIV] disease: Secondary | ICD-10-CM | POA: Diagnosis not present

## 2016-11-21 DIAGNOSIS — Z7189 Other specified counseling: Secondary | ICD-10-CM

## 2016-11-21 DIAGNOSIS — Z7185 Encounter for immunization safety counseling: Secondary | ICD-10-CM | POA: Insufficient documentation

## 2016-11-21 DIAGNOSIS — Z23 Encounter for immunization: Secondary | ICD-10-CM | POA: Diagnosis not present

## 2016-11-21 NOTE — Assessment & Plan Note (Signed)
Discussed the flu vaccine and given today Will start hepatitis A series next visit

## 2016-11-21 NOTE — Progress Notes (Signed)
   Subjective:    Patient ID: Steven DownsJames W Collins, male    DOB: 07-30-1965, 51 y.o.   MRN: [id number]  HPI Here for follow up of HIV Has been followed by PharmD due to adherence issues since he was running out due to insurance delays and now is back on track on dolutegravir and Descovy.  Denies missed doses now.  No new issues.  Still smoking.  Viral load in July suppressed.  No associated n/v/d.  Has some difficulty moving once he is at rest but no isolated leg pain.     Review of Systems  Constitutional: Negative for fatigue.  Gastrointestinal: Negative for diarrhea.  Skin: Negative for rash.  Neurological: Negative for dizziness.       Objective:   Physical Exam  Constitutional: He appears well-developed and well-nourished. No distress.  Eyes: No scleral icterus.  Cardiovascular: Normal rate, regular rhythm and normal heart sounds.   No murmur heard. Pulmonary/Chest: Effort normal and breath sounds normal. No respiratory distress.  Lymphadenopathy:    He has no cervical adenopathy.  Skin: No rash noted.   SH: + tobacco, no interested in quitting       Assessment & Plan:

## 2016-11-21 NOTE — Assessment & Plan Note (Signed)
Discussed need to quit.  Pt precontemplative

## 2016-11-21 NOTE — Assessment & Plan Note (Signed)
Doing well now.  Will check labs again today and rtc 4 months.  May space it out after that.

## 2016-11-22 LAB — T-HELPER CELL (CD4) - (RCID CLINIC ONLY)
CD4 T CELL HELPER: 19 % — AB (ref 33–55)
CD4 T Cell Abs: 560 /uL (ref 400–2700)

## 2016-11-23 LAB — HIV-1 RNA QUANT-NO REFLEX-BLD
HIV 1 RNA Quant: 29 copies/mL — ABNORMAL HIGH
HIV-1 RNA QUANT, LOG: 1.46 {Log_copies}/mL — AB

## 2017-03-27 ENCOUNTER — Ambulatory Visit: Payer: Self-pay | Admitting: Internal Medicine

## 2017-05-10 ENCOUNTER — Encounter: Payer: Self-pay | Admitting: Internal Medicine

## 2017-05-10 ENCOUNTER — Ambulatory Visit (INDEPENDENT_AMBULATORY_CARE_PROVIDER_SITE_OTHER): Payer: 59 | Admitting: Internal Medicine

## 2017-05-10 ENCOUNTER — Other Ambulatory Visit (HOSPITAL_COMMUNITY)
Admission: RE | Admit: 2017-05-10 | Discharge: 2017-05-10 | Disposition: A | Discharge status: Home / Self Care | Payer: 59 | Source: Ambulatory Visit | Attending: Internal Medicine | Admitting: Internal Medicine

## 2017-05-10 VITALS — BP 158/95 | HR 79 | Temp 98.4°F | Ht 64.0 in | Wt 213.0 lb

## 2017-05-10 DIAGNOSIS — B20 Human immunodeficiency virus [HIV] disease: Secondary | ICD-10-CM

## 2017-05-10 DIAGNOSIS — Z7189 Other specified counseling: Secondary | ICD-10-CM

## 2017-05-10 DIAGNOSIS — Z113 Encounter for screening for infections with a predominantly sexual mode of transmission: Secondary | ICD-10-CM | POA: Insufficient documentation

## 2017-05-10 DIAGNOSIS — Z79899 Other long term (current) drug therapy: Secondary | ICD-10-CM | POA: Diagnosis not present

## 2017-05-10 DIAGNOSIS — Z720 Tobacco use: Secondary | ICD-10-CM

## 2017-05-10 DIAGNOSIS — Z7185 Encounter for immunization safety counseling: Secondary | ICD-10-CM

## 2017-05-10 MED ORDER — EMTRICITABINE-TENOFOVIR AF 200-25 MG PO TABS: 1.0000 | ORAL_TABLET | Freq: Every day | ORAL | 3 refills | Status: DC

## 2017-05-10 MED ORDER — DOLUTEGRAVIR SODIUM 50 MG PO TABS: 50.0000 mg | ORAL_TABLET | Freq: Every day | ORAL | 3 refills | Status: DC

## 2017-05-10 NOTE — Assessment & Plan Note (Addendum)
Will be due for a pneumovax next visit Will consider hepatitis A series next visit

## 2017-05-10 NOTE — Assessment & Plan Note (Signed)
Doing well now.  Labs today and rtc 6 months unless concerns.

## 2017-05-10 NOTE — Assessment & Plan Note (Signed)
Counseled, precontemplative 

## 2017-05-10 NOTE — Progress Notes (Signed)
   Subjective:    Patient ID: Steven Collins, male    DOB: 08/25/1965, 52 y.o.   MRN: [id number]  HPI Here for follow up of HIV He continues on dolutegravir and Descovy after previously having poor compliance.  Denies missed doses now.  No new issues.  Still smoking.  Viral load last visit just 29 copies, CD4 560.  No associated n/v/d.  Some occasional cramping abdominal pain.     Review of Systems  Constitutional: Negative for fatigue.  Gastrointestinal: Negative for diarrhea.  Skin: Negative for rash.  Neurological: Negative for dizziness.       Objective:   Physical Exam  Constitutional: He appears well-developed and well-nourished. No distress.  Eyes: No scleral icterus.  Cardiovascular: Normal rate, regular rhythm and normal heart sounds.  No murmur heard. Pulmonary/Chest: Effort normal and breath sounds normal. No respiratory distress.  Lymphadenopathy:    He has no cervical adenopathy.  Skin: No rash noted.   SH: + tobacco, no interested in quitting       Assessment & Plan:

## 2017-05-10 NOTE — Assessment & Plan Note (Signed)
Will screen today 

## 2017-05-10 NOTE — Addendum Note (Signed)
Addended by: COMER, ROBERT W on: 05/10/2017 11:53 AM   Modules accepted: Orders  

## 2017-05-11 LAB — CBC WITH DIFFERENTIAL/PLATELET
Basophils Absolute: 158 cells/uL (ref 0–200)
Basophils Relative: 1.9 %
EOS ABS: 349 {cells}/uL (ref 15–500)
Eosinophils Relative: 4.2 %
HCT: 44 % (ref 38.5–50.0)
Hemoglobin: 15.5 g/dL (ref 13.2–17.1)
Lymphs Abs: 3054 cells/uL (ref 850–3900)
MCH: 29.9 pg (ref 27.0–33.0)
MCHC: 35.2 g/dL (ref 32.0–36.0)
MCV: 84.9 fL (ref 80.0–100.0)
MONOS PCT: 6.8 %
MPV: 9.4 fL (ref 7.5–12.5)
Neutro Abs: 4175 cells/uL (ref 1500–7800)
Neutrophils Relative %: 50.3 %
PLATELETS: 310 10*3/uL (ref 140–400)
RBC: 5.18 10*6/uL (ref 4.20–5.80)
RDW: 13 % (ref 11.0–15.0)
TOTAL LYMPHOCYTE: 36.8 %
WBC mixed population: 564 cells/uL (ref 200–950)
WBC: 8.3 10*3/uL (ref 3.8–10.8)

## 2017-05-11 LAB — COMPLETE METABOLIC PANEL WITH GFR
AG RATIO: 1.2 (calc) (ref 1.0–2.5)
ALKALINE PHOSPHATASE (APISO): 65 U/L (ref 40–115)
ALT: 11 U/L (ref 9–46)
AST: 14 U/L (ref 10–35)
Albumin: 4.2 g/dL (ref 3.6–5.1)
BILIRUBIN TOTAL: 0.3 mg/dL (ref 0.2–1.2)
BUN: 15 mg/dL (ref 7–25)
CO2: 24 mmol/L (ref 20–32)
Calcium: 9.4 mg/dL (ref 8.6–10.3)
Chloride: 105 mmol/L (ref 98–110)
Creat: 0.89 mg/dL (ref 0.70–1.33)
GFR, Est African American: 113 mL/min/{1.73_m2} (ref >=60)
GFR, Est Non African American: 98 mL/min/{1.73_m2} (ref >=60)
Globulin: 3.6 g/dL (calc) (ref 1.9–3.7)
Glucose, Bld: 96 mg/dL (ref 65–99)
POTASSIUM: 5.3 mmol/L (ref 3.5–5.3)
SODIUM: 137 mmol/L (ref 135–146)
Total Protein: 7.8 g/dL (ref 6.1–8.1)

## 2017-05-11 LAB — LIPID PANEL
CHOLESTEROL: 211 mg/dL — AB (ref <200)
HDL: 34 mg/dL — ABNORMAL LOW (ref >40)
LDL Cholesterol (Calc): 152 mg/dL (calc) — ABNORMAL HIGH
Non-HDL Cholesterol (Calc): 177 mg/dL (calc) — ABNORMAL HIGH (ref <130)
Total CHOL/HDL Ratio: 6.2 (calc) — ABNORMAL HIGH (ref <5.0)
Triglycerides: 130 mg/dL (ref <150)

## 2017-05-11 LAB — URINE CYTOLOGY ANCILLARY ONLY
CHLAMYDIA, DNA PROBE: NEGATIVE
Neisseria Gonorrhea: NEGATIVE

## 2017-05-11 LAB — T-HELPER CELL (CD4) - (RCID CLINIC ONLY)
CD4 T CELL ABS: 640 /uL (ref 400–2700)
CD4 T CELL HELPER: 19 % — AB (ref 33–55)

## 2017-05-11 LAB — RPR: RPR Ser Ql: NONREACTIVE

## 2017-05-12 LAB — HIV-1 RNA QUANT-NO REFLEX-BLD
HIV 1 RNA Quant: <20 copies/mL — AB
HIV-1 RNA Quant, Log: <1.3 Log copies/mL — AB

## 2017-11-07 ENCOUNTER — Encounter: Payer: Self-pay | Admitting: Internal Medicine

## 2017-11-07 ENCOUNTER — Ambulatory Visit (INDEPENDENT_AMBULATORY_CARE_PROVIDER_SITE_OTHER): Payer: 59 | Admitting: Internal Medicine

## 2017-11-07 VITALS — BP 162/87 | HR 76 | Temp 98.6°F | Ht 66.0 in | Wt 212.0 lb

## 2017-11-07 DIAGNOSIS — B20 Human immunodeficiency virus [HIV] disease: Secondary | ICD-10-CM | POA: Diagnosis not present

## 2017-11-07 DIAGNOSIS — Z720 Tobacco use: Secondary | ICD-10-CM

## 2017-11-07 DIAGNOSIS — Z23 Encounter for immunization: Secondary | ICD-10-CM | POA: Diagnosis not present

## 2017-11-07 NOTE — Assessment & Plan Note (Signed)
Prevnar today and repeat hepatitis A series.

## 2017-11-07 NOTE — Assessment & Plan Note (Signed)
Discussed cessation 

## 2017-11-07 NOTE — Assessment & Plan Note (Signed)
Doing well on his regimen.   Labs today and rtc 6 months.

## 2017-11-07 NOTE — Progress Notes (Signed)
   Subjective:    Patient ID: Steven Collins, Steven Collins    DOB: [birth year], 52 y.o.   MRN: [id number]  HPI Here for follow up of HIV He continues on dolutegravir and Descovy after previously having poor compliance.  Continues with no missed doses.  No new issues.  Still smoking.  Viral load last visit <20 copies, CD4 640.  No associated n/v/d.  Recently had a stroke with visual changes.     Review of Systems  Constitutional: Negative for fatigue.  Gastrointestinal: Negative for diarrhea.  Skin: Negative for rash.  Neurological: Negative for dizziness.       Objective:   Physical Exam  Constitutional: He appears well-developed and well-nourished. No distress.  Eyes: No scleral icterus.  Cardiovascular: Normal rate, regular rhythm and normal heart sounds.  No murmur heard. Pulmonary/Chest: Effort normal and breath sounds normal. No respiratory distress.  Lymphadenopathy:    He has no cervical adenopathy.  Skin: No rash noted.   SH: + tobacco, wants to quit but hasn't been successful       Assessment & Plan:

## 2017-11-08 LAB — T-HELPER CELL (CD4) - (RCID CLINIC ONLY)
CD4 T CELL ABS: 650 /uL (ref 400–2700)
CD4 T CELL HELPER: 22 % — AB (ref 33–55)

## 2017-11-09 LAB — HIV-1 RNA QUANT-NO REFLEX-BLD
HIV 1 RNA QUANT: 50 {copies}/mL — AB
HIV-1 RNA Quant, Log: 1.7 Log copies/mL — ABNORMAL HIGH

## 2018-04-02 ENCOUNTER — Other Ambulatory Visit: Payer: Self-pay | Admitting: *Deleted

## 2018-04-02 DIAGNOSIS — B20 Human immunodeficiency virus [HIV] disease: Secondary | ICD-10-CM

## 2018-04-02 MED ORDER — EMTRICITABINE-TENOFOVIR AF 200-25 MG PO TABS: 1.0000 | ORAL_TABLET | Freq: Every day | ORAL | 1 refills | Status: DC

## 2018-04-02 MED ORDER — DOLUTEGRAVIR SODIUM 50 MG PO TABS: 50.0000 mg | ORAL_TABLET | Freq: Every day | ORAL | 1 refills | Status: DC

## 2018-05-14 ENCOUNTER — Ambulatory Visit: Payer: 59 | Admitting: Internal Medicine

## 2018-06-04 ENCOUNTER — Ambulatory Visit (INDEPENDENT_AMBULATORY_CARE_PROVIDER_SITE_OTHER): Payer: 59 | Admitting: Internal Medicine

## 2018-06-04 ENCOUNTER — Other Ambulatory Visit (HOSPITAL_COMMUNITY)
Admission: RE | Admit: 2018-06-04 | Discharge: 2018-06-04 | Disposition: A | Discharge status: Home / Self Care | Payer: 59 | Source: Ambulatory Visit | Attending: Internal Medicine | Admitting: Internal Medicine

## 2018-06-04 ENCOUNTER — Other Ambulatory Visit: Payer: Self-pay

## 2018-06-04 ENCOUNTER — Encounter: Payer: Self-pay | Admitting: Internal Medicine

## 2018-06-04 VITALS — BP 157/79 | HR 82 | Temp 98.3°F | Wt 243.0 lb

## 2018-06-04 DIAGNOSIS — Z7189 Other specified counseling: Secondary | ICD-10-CM

## 2018-06-04 DIAGNOSIS — Z720 Tobacco use: Secondary | ICD-10-CM

## 2018-06-04 DIAGNOSIS — Z113 Encounter for screening for infections with a predominantly sexual mode of transmission: Secondary | ICD-10-CM | POA: Insufficient documentation

## 2018-06-04 DIAGNOSIS — Z23 Encounter for immunization: Secondary | ICD-10-CM

## 2018-06-04 DIAGNOSIS — B20 Human immunodeficiency virus [HIV] disease: Secondary | ICD-10-CM | POA: Insufficient documentation

## 2018-06-04 DIAGNOSIS — Z7185 Encounter for immunization safety counseling: Secondary | ICD-10-CM

## 2018-06-04 NOTE — Assessment & Plan Note (Signed)
Will screen today 

## 2018-06-04 NOTE — Assessment & Plan Note (Signed)
Doing well by his report.  Labs today and rtc 6 months unless concerns

## 2018-06-04 NOTE — Assessment & Plan Note (Signed)
Counseled on cessation 

## 2018-06-04 NOTE — Assessment & Plan Note (Signed)
Hepatitis A #2 today

## 2018-06-04 NOTE — Progress Notes (Signed)
   Subjective:    Patient ID: Steven Collins, male    DOB: Oct 23, 1965, 53 y.o.   MRN: [id number]  HPI Here for follow up of HIV He continues on dolutegravir and Descovy after previously having poor compliance.  Continues with no missed doses.  No new issues.  Still smoking.  Viral load last visit just 50 copies, CD4 650.  No associated n/v/d.    Review of Systems  Constitutional: Negative for fatigue.  Gastrointestinal: Negative for diarrhea.  Skin: Negative for rash.  Neurological: Negative for dizziness.       Objective:   Physical Exam  Constitutional: He appears well-developed and well-nourished. No distress.  Eyes: No scleral icterus.  Cardiovascular: Normal rate, regular rhythm and normal heart sounds.  No murmur heard. Pulmonary/Chest: Effort normal and breath sounds normal. No respiratory distress.  Skin: No rash noted.   SH: + tobacco       Assessment & Plan:

## 2018-06-05 LAB — URINE CYTOLOGY ANCILLARY ONLY
Chlamydia: NEGATIVE
NEISSERIA GONORRHEA: NEGATIVE

## 2018-06-05 LAB — T-HELPER CELL (CD4) - (RCID CLINIC ONLY)
CD4 T CELL ABS: 610 /uL (ref 400–2700)
CD4 T CELL HELPER: 21 % — AB (ref 33–55)

## 2018-06-06 LAB — CBC WITH DIFFERENTIAL/PLATELET
Absolute Monocytes: 679 cells/uL (ref 200–950)
BASOS ABS: 103 {cells}/uL (ref 0–200)
Basophils Relative: 1.2 %
EOS PCT: 4.4 %
Eosinophils Absolute: 378 cells/uL (ref 15–500)
HEMATOCRIT: 44 % (ref 38.5–50.0)
HEMOGLOBIN: 15.1 g/dL (ref 13.2–17.1)
LYMPHS ABS: 2933 {cells}/uL (ref 850–3900)
MCH: 30 pg (ref 27.0–33.0)
MCHC: 34.3 g/dL (ref 32.0–36.0)
MCV: 87.5 fL (ref 80.0–100.0)
MONOS PCT: 7.9 %
MPV: 9.3 fL (ref 7.5–12.5)
NEUTROS ABS: 4506 {cells}/uL (ref 1500–7800)
NEUTROS PCT: 52.4 %
Platelets: 311 10*3/uL (ref 140–400)
RBC: 5.03 10*6/uL (ref 4.20–5.80)
RDW: 13.2 % (ref 11.0–15.0)
Total Lymphocyte: 34.1 %
WBC: 8.6 10*3/uL (ref 3.8–10.8)

## 2018-06-06 LAB — COMPLETE METABOLIC PANEL WITH GFR
AG RATIO: 1.2 (calc) (ref 1.0–2.5)
ALKALINE PHOSPHATASE (APISO): 53 U/L (ref 35–144)
ALT: 15 U/L (ref 9–46)
AST: 16 U/L (ref 10–35)
Albumin: 4.1 g/dL (ref 3.6–5.1)
BILIRUBIN TOTAL: 0.3 mg/dL (ref 0.2–1.2)
BUN: 15 mg/dL (ref 7–25)
CHLORIDE: 102 mmol/L (ref 98–110)
CO2: 24 mmol/L (ref 20–32)
Calcium: 9.3 mg/dL (ref 8.6–10.3)
Creat: 0.96 mg/dL (ref 0.70–1.33)
GFR, EST AFRICAN AMERICAN: 103 mL/min/{1.73_m2} (ref >=60)
GFR, Est Non African American: 89 mL/min/{1.73_m2} (ref >=60)
GLUCOSE: 105 mg/dL — AB (ref 65–99)
Globulin: 3.3 g/dL (calc) (ref 1.9–3.7)
POTASSIUM: 5.3 mmol/L (ref 3.5–5.3)
Sodium: 135 mmol/L (ref 135–146)
Total Protein: 7.4 g/dL (ref 6.1–8.1)

## 2018-06-06 LAB — HIV-1 RNA QUANT-NO REFLEX-BLD
HIV 1 RNA Quant: 59 copies/mL — ABNORMAL HIGH
HIV-1 RNA Quant, Log: 1.77 Log copies/mL — ABNORMAL HIGH

## 2018-06-06 LAB — RPR: RPR Ser Ql: NONREACTIVE

## 2018-10-22 ENCOUNTER — Other Ambulatory Visit: Payer: Self-pay

## 2018-10-22 DIAGNOSIS — B20 Human immunodeficiency virus [HIV] disease: Secondary | ICD-10-CM

## 2018-10-22 MED ORDER — DESCOVY 200-25 MG PO TABS: 1.0000 | ORAL_TABLET | Freq: Every day | ORAL | 2 refills | Status: DC

## 2018-10-22 MED ORDER — TIVICAY 50 MG PO TABS: 50.0000 mg | ORAL_TABLET | Freq: Every day | ORAL | 2 refills | Status: DC

## 2018-10-23 ENCOUNTER — Telehealth: Payer: Self-pay | Admitting: Pharmacy Technician

## 2018-11-29 ENCOUNTER — Telehealth: Payer: Self-pay

## 2018-11-29 NOTE — Telephone Encounter (Signed)
[facility] incoming [facility] from [facility]. Pharmacy has tried serveral times

## 2018-12-05 ENCOUNTER — Ambulatory Visit: Payer: 59 | Admitting: Internal Medicine

## 2019-01-07 ENCOUNTER — Telehealth: Payer: Self-pay

## 2019-01-07 ENCOUNTER — Ambulatory Visit: Payer: 59 | Admitting: Internal Medicine

## 2019-01-07 NOTE — Telephone Encounter (Signed)
Attempted to call patient to reschedule missed appointment. Unable to reach patient directly at this time;left voicemail requesting patient call office back to reschedule. [place]

## 2019-02-05 ENCOUNTER — Other Ambulatory Visit: Payer: Self-pay | Admitting: Internal Medicine

## 2019-02-05 ENCOUNTER — Ambulatory Visit (INDEPENDENT_AMBULATORY_CARE_PROVIDER_SITE_OTHER): Payer: 59 | Admitting: Internal Medicine

## 2019-02-05 ENCOUNTER — Encounter: Payer: Self-pay | Admitting: Internal Medicine

## 2019-02-05 ENCOUNTER — Other Ambulatory Visit: Payer: Self-pay

## 2019-02-05 VITALS — BP 155/84 | HR 84 | Temp 98.0°F | Ht 65.0 in | Wt 235.0 lb

## 2019-02-05 DIAGNOSIS — Z23 Encounter for immunization: Secondary | ICD-10-CM | POA: Diagnosis not present

## 2019-02-05 DIAGNOSIS — Z720 Tobacco use: Secondary | ICD-10-CM

## 2019-02-05 DIAGNOSIS — B20 Human immunodeficiency virus [HIV] disease: Secondary | ICD-10-CM | POA: Diagnosis not present

## 2019-02-05 NOTE — Progress Notes (Signed)
   Subjective:    Patient ID: Steven Collins, male    DOB: October 15, 1965, 53 y.o.   MRN: [id number]  HPI Here for follow up of HIV He continues on Genvoya and denies any missed doses.  Last CD4 of 610 and viral load 59.  No associated n/v/d.  Had ben off his medications last year some.  No new issues.    Review of Systems  Constitutional: Negative for fatigue.  Gastrointestinal: Negative for diarrhea.  Skin: Negative for rash.  Neurological: Negative for dizziness.       Objective:   Physical Exam  Constitutional: He appears well-developed and well-nourished. No distress.  Eyes: No scleral icterus.  Cardiovascular: Normal rate, regular rhythm and normal heart sounds.  No murmur heard. Pulmonary/Chest: Effort normal and breath sounds normal. No respiratory distress.  Skin: No rash noted.   SH: + tobacco       Assessment & Plan:

## 2019-02-05 NOTE — Assessment & Plan Note (Signed)
Discussed cessation and he is precontemplative 

## 2019-02-05 NOTE — Assessment & Plan Note (Signed)
He has been doing well again and will recheck his labs rtc 6 months

## 2019-02-05 NOTE — Assessment & Plan Note (Signed)
Discussed the vaccine and given today 

## 2019-02-06 LAB — T-HELPER CELL (CD4) - (RCID CLINIC ONLY)
CD4 % Helper T Cell: 30 % — ABNORMAL LOW (ref 33–65)
CD4 T Cell Abs: 979 /uL (ref 400–1790)

## 2019-02-12 LAB — HIV-1 RNA QUANT-NO REFLEX-BLD
HIV 1 RNA Quant: 46 copies/mL — ABNORMAL HIGH
HIV-1 RNA Quant, Log: 1.66 Log copies/mL — ABNORMAL HIGH

## 2019-03-24 ENCOUNTER — Other Ambulatory Visit: Payer: Self-pay | Admitting: Pharmacist

## 2019-03-24 DIAGNOSIS — B20 Human immunodeficiency virus [HIV] disease: Secondary | ICD-10-CM

## 2019-03-24 MED ORDER — DESCOVY 200-25 MG PO TABS: 1.0000 | ORAL_TABLET | Freq: Every day | ORAL | 7 refills | Status: DC

## 2019-03-24 MED ORDER — TIVICAY 50 MG PO TABS: 50.0000 mg | ORAL_TABLET | Freq: Every day | ORAL | 7 refills | Status: DC

## 2019-03-25 ENCOUNTER — Telehealth: Payer: Self-pay | Admitting: Pharmacy Technician

## 2019-03-25 ENCOUNTER — Telehealth: Payer: Self-pay | Admitting: Pharmacist

## 2019-03-25 ENCOUNTER — Other Ambulatory Visit: Payer: Self-pay | Admitting: Pharmacist

## 2019-03-25 DIAGNOSIS — B20 Human immunodeficiency virus [HIV] disease: Secondary | ICD-10-CM

## 2019-03-25 MED ORDER — BIKTARVY 50-200-25 MG PO TABS: 1.0000 | ORAL_TABLET | Freq: Every day | ORAL | 2 refills | Status: DC

## 2019-03-25 MED FILL — BIKTARVY 50-200-25 MG TABS: 50-200-25 | 30 days supply | Qty: 30 | Fill #0

## 2019-03-25 NOTE — Telephone Encounter (Signed)
[facility] Patient Advocate Encounter   Was successful in obtaining a [place] copay card for [employer].  This copay card will make the patients copay $0.  The billing information is as follows and has been shared with [facility].  RxBin: [id number] PCN: ACCESS Member ID: [id number] Group ID: [id number]  Kathie Rhodes E. Dimas Aguas CPhT Specialty Pharmacy Patient [facility] Phone: [phone] Fax:  [phone]

## 2019-04-27 MED FILL — BIKTARVY 50-200-25 MG TABS: 50-200-25 | 30 days supply | Qty: 30 | Fill #1

## 2019-06-03 MED FILL — BIKTARVY 50-200-25 MG TABS: 50-200-25 | 30 days supply | Qty: 30 | Fill #2

## 2019-07-01 ENCOUNTER — Other Ambulatory Visit: Payer: Self-pay | Admitting: Pharmacist

## 2019-07-01 DIAGNOSIS — B20 Human immunodeficiency virus [HIV] disease: Secondary | ICD-10-CM

## 2019-07-01 MED FILL — BIKTARVY 50-200-25 MG TABS: 50-200-25 | 30 days supply | Qty: 30 | Fill #0

## 2019-07-30 ENCOUNTER — Other Ambulatory Visit: Payer: Self-pay | Admitting: Pharmacist

## 2019-07-30 DIAGNOSIS — B20 Human immunodeficiency virus [HIV] disease: Secondary | ICD-10-CM

## 2019-08-05 ENCOUNTER — Ambulatory Visit: Payer: 59 | Admitting: Internal Medicine

## 2019-09-04 ENCOUNTER — Other Ambulatory Visit: Payer: Self-pay | Admitting: Internal Medicine

## 2019-09-04 DIAGNOSIS — B20 Human immunodeficiency virus [HIV] disease: Secondary | ICD-10-CM

## 2019-09-04 MED FILL — BIKTARVY 50-200-25 MG TABS: 50-200-25 | 30 days supply | Qty: 30 | Fill #0

## 2019-09-08 ENCOUNTER — Ambulatory Visit: Payer: 59 | Admitting: Internal Medicine

## 2019-10-04 ENCOUNTER — Other Ambulatory Visit: Payer: Self-pay | Admitting: Internal Medicine

## 2019-10-04 DIAGNOSIS — B20 Human immunodeficiency virus [HIV] disease: Secondary | ICD-10-CM

## 2019-10-08 MED FILL — BIKTARVY 50-200-25 MG TABS: 50-200-25 | 30 days supply | Qty: 30 | Fill #0

## 2019-10-29 ENCOUNTER — Ambulatory Visit: Payer: 59 | Admitting: Internal Medicine

## 2019-11-04 ENCOUNTER — Telehealth: Payer: Self-pay | Admitting: Pharmacy Technician

## 2019-11-04 ENCOUNTER — Ambulatory Visit: Payer: 59 | Admitting: Pharmacist

## 2019-11-04 NOTE — Telephone Encounter (Signed)
[facility] Patient Advocate Encounter  Insurance verification completed.    The patient is insured through [employer] and has a $0 copay.    Steven Collins E. Steven Collins, CPhT Specialty Pharmacy Patient [facility]ocate [facility] Phone: [phone] Fax:  [phone]   

## 2019-11-06 ENCOUNTER — Other Ambulatory Visit: Payer: Self-pay | Admitting: Internal Medicine

## 2019-11-06 DIAGNOSIS — B20 Human immunodeficiency virus [HIV] disease: Secondary | ICD-10-CM

## 2019-11-07 ENCOUNTER — Other Ambulatory Visit: Payer: Self-pay | Admitting: Internal Medicine

## 2019-11-07 DIAGNOSIS — B20 Human immunodeficiency virus [HIV] disease: Secondary | ICD-10-CM

## 2019-11-10 ENCOUNTER — Other Ambulatory Visit: Payer: Self-pay | Admitting: Internal Medicine

## 2019-11-10 DIAGNOSIS — B20 Human immunodeficiency virus [HIV] disease: Secondary | ICD-10-CM

## 2019-11-11 ENCOUNTER — Other Ambulatory Visit: Payer: Self-pay

## 2019-11-11 ENCOUNTER — Other Ambulatory Visit (HOSPITAL_COMMUNITY): Payer: Self-pay | Admitting: Pharmacist

## 2019-11-11 ENCOUNTER — Ambulatory Visit (INDEPENDENT_AMBULATORY_CARE_PROVIDER_SITE_OTHER): Payer: 59 | Admitting: Pharmacist

## 2019-11-11 ENCOUNTER — Other Ambulatory Visit (HOSPITAL_COMMUNITY)
Admission: RE | Admit: 2019-11-11 | Discharge: 2019-11-11 | Disposition: A | Discharge status: Home / Self Care | Payer: 59 | Source: Ambulatory Visit | Attending: Internal Medicine | Admitting: Internal Medicine

## 2019-11-11 DIAGNOSIS — Z113 Encounter for screening for infections with a predominantly sexual mode of transmission: Secondary | ICD-10-CM | POA: Insufficient documentation

## 2019-11-11 DIAGNOSIS — B20 Human immunodeficiency virus [HIV] disease: Secondary | ICD-10-CM

## 2019-11-11 DIAGNOSIS — Z79899 Other long term (current) drug therapy: Secondary | ICD-10-CM | POA: Diagnosis not present

## 2019-11-11 MED ORDER — BIKTARVY 50-200-25 MG PO TABS: 1.0000 | ORAL_TABLET | Freq: Every day | ORAL | 5 refills | Status: DC

## 2019-11-11 MED FILL — BIKTARVY 50-200-25 MG TABS: 50-200-25 | 30 days supply | Qty: 30 | Fill #0

## 2019-11-11 NOTE — Progress Notes (Signed)
HPI: Steven Collins is a 54 y.o. male who presents to the [facility] pharmacy clinic for HIV follow-up.  Patient Active Problem List   Diagnosis Date Noted  . Tobacco abuse 03/23/2015  . Loss of weight 12/17/2014  . Screening examination for venereal disease 09/22/2014  . Encounter for long-term (current) use of medications 09/22/2014  . DENTAL CARIES 07/15/2009  . HYPERLIPIDEMIA 12/31/2007  . FOLLICULITIS 09/17/2007  . HSV 05/04/2006  . Human immunodeficiency virus (HIV) disease (HCC) 02/02/2006    Patient's Medications  New Prescriptions   No medications on file  Previous Medications   No medications on file  Modified Medications   Modified Medication Previous Medication   BICTEGRAVIR-EMTRICITABINE-TENOFOVIR AF (BIKTARVY) 50-200-25 MG TABS TABLET BIKTARVY 50-200-25 MG TABS tablet      Take 1 tablet by mouth daily.    TAKE 1 TABLET BY MOUTH DAILY.  Discontinued Medications   No medications on file    Allergies: No Known Allergies  Past Medical History: No past medical history on file.  Social History: Social History   Socioeconomic History  . Marital status: Single    Spouse name: Not on file  . Number of children: Not on file  . Years of education: Not on file  . Highest education level: Not on file  Occupational History  . Not on file  Tobacco Use  . Smoking status: Current Every Day Smoker    Packs/day: 1.00    Types: Cigarettes  . Smokeless tobacco: Never Used  Vaping Use  . Vaping Use: Never used  Substance and Sexual Activity  . Alcohol use: No    Alcohol/week: 0.0 standard drinks  . 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 Sexual activity: Yes    Partners: Female    Comment: declined condoms  Other Topics Concern  . Not on file  Social History Narrative  . Not on file   Social Determinants of Health   Financial Resource Strain:   . Difficulty of Paying Living Expenses: Not on file  Food Insecurity:   . Worried About Programme researcher, broadcasting/film/video in  the Last Year: Not on file  . Ran Out of Food in the Last Year: Not on file  Transportation Needs:   . Lack of Transportation (Medical): Not on file  . Lack of Transportation (Non-Medical): Not on file  Physical Activity:   . Days of Exercise per Week: Not on file  . Minutes of Exercise per Session: Not on file  Stress:   . Feeling of Stress : Not on file  Social Connections:   . Frequency of Communication with Friends and Family: Not on file  . Frequency of Social Gatherings with Friends and Family: Not on file  . Attends Religious Services: Not on file  . Active Member of Clubs or Organizations: Not on file  . Attends Banker Meetings: Not on file  . Marital Status: Not on file    Labs: Lab Results  Component Value Date   HIV1RNAQUANT 46 (H) 02/05/2019   HIV1RNAQUANT 59 (H) 06/04/2018   HIV1RNAQUANT 50 (H) 11/07/2017   CD4TABS 979 02/05/2019   CD4TABS 610 06/04/2018   CD4TABS 650 11/07/2017    RPR and STI Lab Results  Component Value Date   LABRPR NON-REACTIVE 06/04/2018   LABRPR NON-REACTIVE 05/10/2017   LABRPR NON REAC 07/22/2015   LABRPR NON REAC 09/22/2014   LABRPR NON REAC 09/13/2009    STI Results GC CT  06/04/2018  Negative Negative  05/10/2017 Negative Negative  07/22/2015 Negative Negative  09/22/2014 Negative Negative    Hepatitis B Lab Results  Component Value Date   HEPBSAB INDETER (A) 08/16/2016   HEPBSAG NO 05/14/2006   Hepatitis C No results found for: HEPCAB, HCVRNAPCRQN Hepatitis A Lab Results  Component Value Date   HAV NON REACTIVE 08/16/2016   Lipids: Lab Results  Component Value Date   CHOL 211 (H) 05/10/2017   TRIG 130 05/10/2017   HDL 34 (L) 05/10/2017   CHOLHDL 6.2 (H) 05/10/2017   VLDL 33 (H) 07/22/2015   LDLCALC 152 (H) 05/10/2017    Current HIV Regimen: Biktarvy 1 tablet once daily  Assessment: Steven Collins was seen today for HIV follow-up with pharmacy due to multiple missed appointments with Dr. Luciana Axe. He was  last seen in clinic November 2020 with a viral load of 46 and CD4 count of 979 at that time. He has been taking Biktarvy everyday but recently ran out of medicine two days ago. He states the main reason for missing appointments was transportation issues since he was traveling from [place]. Reminded patient to call clinic for rescheduling if he cannot make scheduled appointments.   He states he has not experienced any adverse events with Biktarvy but does complain of muscle cramps mainly at night time. He states he feels as though his legs lock in the middle of the night. Will check labs today for any electrolyte abnormalities. He denies taking any other medications including over the counter medications or supplements. He states his cramps were present prior to starting Biktarvy but have gotten worse since initiating. He did not want to change his HIV regimen to assess if Biktarvy is worsening these symptoms. Discussed with him that Susanne Borders does not typically cause these symptoms. Also recommended following up with [facility] (his primary care) for any additional cramping issues or concerns.   Will collect viral load and CD4 count today along with CBC and CMet as patient's last labs are from November 2020. Will send in Biktarvy refills to be delivered to his grandmother's house from [facility]. Plan to follow up in 2 months with Dr. Luciana Axe. He states he plans to receive his flu shot through work next month. He has not received his COVID vaccine but stated he would be willing to receive it when he returns to [facility] in November.   Plan: Refill Biktarvy Follow up with Dr. Luciana Axe on November 8th Check HIV viral load, CD4 count, CBC, CMet, urine for STIs, and lipid panel  Margarite Gouge, PharmD PGY2 ID Pharmacy Resident [phone]  11/11/2019, 2:39 PM

## 2019-11-12 ENCOUNTER — Telehealth: Payer: Self-pay | Admitting: *Deleted

## 2019-11-12 LAB — T-HELPER CELL (CD4) - (RCID CLINIC ONLY)
CD4 % Helper T Cell: 32 % — ABNORMAL LOW (ref 33–65)
CD4 T Cell Abs: 907 /uL (ref 400–1790)

## 2019-11-12 LAB — URINE CYTOLOGY ANCILLARY ONLY
Chlamydia: NEGATIVE
Comment: NEGATIVE
Comment: NORMAL
Neisseria Gonorrhea: NEGATIVE

## 2019-11-12 NOTE — Telephone Encounter (Signed)
Glucose = 470. RN attempted to contact patient. Robin answered the phone. He left for work without his cell phone today. RN asked her to have Fayrene Fearing call his doctor's office this morning. She stated she would pass the message when he calls her at lunch. Please advise. Andree Coss, RN

## 2019-11-12 NOTE — Telephone Encounter (Signed)
Patient returned missed call. Relayed glucose result. Asked patient if he had a PCP; if so to contact them to follow up.  Will forward call to pharmacy per patient request. Lorenso Courier, CMA

## 2019-11-12 NOTE — Telephone Encounter (Signed)
Dr. Comer - FYI

## 2019-11-12 NOTE — Telephone Encounter (Signed)
Called patient this AM and was unable to get in touch with him. Finally spoke to him and relayed glucose results. I told him that he needed to go to [facility] today or go to urgent care today to have a work up. He had muscle cramps, which could be his glucose as well. Explained the risks with elevated glucose levels. Also explained that his LFTs were a little high and his sodium was low. Also discussed his cholesterol levels, especially his elevated triglycerides. I told him that his number one priority should be getting his glucose followed up on. He will call [facility] and see if they can see him today when he gets off work.

## 2019-11-12 NOTE — Telephone Encounter (Signed)
Thanks Cassie 

## 2019-11-13 LAB — COMPREHENSIVE METABOLIC PANEL
AG Ratio: 1.2 (calc) (ref 1.0–2.5)
ALT: 50 U/L — ABNORMAL HIGH (ref 9–46)
AST: 40 U/L — ABNORMAL HIGH (ref 10–35)
Albumin: 4.1 g/dL (ref 3.6–5.1)
Alkaline phosphatase (APISO): 73 U/L (ref 35–144)
BUN: 16 mg/dL (ref 7–25)
CO2: 23 mmol/L (ref 20–32)
Calcium: 9.2 mg/dL (ref 8.6–10.3)
Chloride: 93 mmol/L — ABNORMAL LOW (ref 98–110)
Creat: 0.88 mg/dL (ref 0.70–1.33)
Globulin: 3.3 g/dL (calc) (ref 1.9–3.7)
Glucose, Bld: 470 mg/dL — ABNORMAL HIGH (ref 65–99)
Potassium: 4.4 mmol/L (ref 3.5–5.3)
Sodium: 127 mmol/L — ABNORMAL LOW (ref 135–146)
Total Bilirubin: 0.3 mg/dL (ref 0.2–1.2)
Total Protein: 7.4 g/dL (ref 6.1–8.1)

## 2019-11-13 LAB — CBC WITH DIFFERENTIAL/PLATELET
Absolute Monocytes: 627 cells/uL (ref 200–950)
Basophils Absolute: 118 cells/uL (ref 0–200)
Basophils Relative: 1.2 %
Eosinophils Absolute: 216 cells/uL (ref 15–500)
Eosinophils Relative: 2.2 %
HCT: 48.3 % (ref 38.5–50.0)
Hemoglobin: 15.9 g/dL (ref 13.2–17.1)
Lymphs Abs: 3185 cells/uL (ref 850–3900)
MCH: 30.2 pg (ref 27.0–33.0)
MCHC: 32.9 g/dL (ref 32.0–36.0)
MCV: 91.7 fL (ref 80.0–100.0)
MPV: 10.2 fL (ref 7.5–12.5)
Monocytes Relative: 6.4 %
Neutro Abs: 5655 cells/uL (ref 1500–7800)
Neutrophils Relative %: 57.7 %
Platelets: 245 10*3/uL (ref 140–400)
RBC: 5.27 10*6/uL (ref 4.20–5.80)
RDW: 12 % (ref 11.0–15.0)
Total Lymphocyte: 32.5 %
WBC: 9.8 10*3/uL (ref 3.8–10.8)

## 2019-11-13 LAB — LIPID PANEL
Cholesterol: 238 mg/dL — ABNORMAL HIGH (ref <200)
HDL: 27 mg/dL — ABNORMAL LOW (ref >=40)
Non-HDL Cholesterol (Calc): 211 mg/dL (calc) — ABNORMAL HIGH (ref <130)
Total CHOL/HDL Ratio: 8.8 (calc) — ABNORMAL HIGH (ref <5.0)
Triglycerides: 483 mg/dL — ABNORMAL HIGH (ref <150)

## 2019-11-13 LAB — RPR: RPR Ser Ql: NONREACTIVE

## 2019-11-13 LAB — HIV-1 RNA QUANT-NO REFLEX-BLD
HIV 1 RNA Quant: 140 Copies/mL — ABNORMAL HIGH
HIV-1 RNA Quant, Log: 2.15 Log cps/mL — ABNORMAL HIGH

## 2019-12-09 MED FILL — BIKTARVY 50-200-25 MG TABS: 50-200-25 | 30 days supply | Qty: 30 | Fill #1

## 2019-12-17 NOTE — Telephone Encounter (Signed)
error 

## 2020-01-08 MED FILL — BIKTARVY 50-200-25 MG TABS: 50-200-25 | 30 days supply | Qty: 30 | Fill #2

## 2020-01-26 ENCOUNTER — Ambulatory Visit: Payer: 59 | Admitting: Internal Medicine

## 2020-02-09 MED FILL — BIKTARVY 50-200-25 MG TABS: 50-200-25 | 30 days supply | Qty: 30 | Fill #3

## 2020-02-16 ENCOUNTER — Encounter: Payer: Self-pay | Admitting: Internal Medicine

## 2020-02-16 ENCOUNTER — Other Ambulatory Visit: Payer: Self-pay

## 2020-02-16 ENCOUNTER — Ambulatory Visit (INDEPENDENT_AMBULATORY_CARE_PROVIDER_SITE_OTHER): Payer: 59 | Admitting: Internal Medicine

## 2020-02-16 VITALS — BP 147/74 | HR 86 | Temp 98.3°F | Wt 228.0 lb

## 2020-02-16 DIAGNOSIS — B20 Human immunodeficiency virus [HIV] disease: Secondary | ICD-10-CM

## 2020-02-16 DIAGNOSIS — Z7689 Persons encountering health services in other specified circumstances: Secondary | ICD-10-CM | POA: Diagnosis not present

## 2020-02-16 DIAGNOSIS — R7401 Elevation of levels of liver transaminase levels: Secondary | ICD-10-CM

## 2020-02-16 DIAGNOSIS — Z720 Tobacco use: Secondary | ICD-10-CM | POA: Diagnosis not present

## 2020-02-16 NOTE — Assessment & Plan Note (Signed)
Elevated last visit which was new.  Will recheck today and if it remains elevated, will consider further evaluation next visit.

## 2020-02-16 NOTE — Assessment & Plan Note (Signed)
Discussed cessation 

## 2020-02-16 NOTE — Assessment & Plan Note (Signed)
He is doing well though I am not sure why he has had persistently detectable virus since changing to [place].  Will check today and if it remains up over 50 or so, will add Prezcobix Otherwise rtc in 6 months.

## 2020-02-16 NOTE — Assessment & Plan Note (Signed)
Dental referral placed today for [facility]. Information to schedule appointment completed today.   

## 2020-02-16 NOTE — Progress Notes (Signed)
   Subjective:    Patient ID: Steven Collins, male    DOB: [birth year], 54 y.o.   MRN: [id number]  HPI Here for follow up of HIV He continues on Biktarvy and no missed doses.  Last viral load though was 140, though previously had remained undetectable when on Tivicay and Descovy.  He is having issues with his teeth and was seeing a local dentist but unable to afford.  No new issues otherwise. Getting the COVID vaccine at work and had his flu shot at work already.  Last labs with transaminitis.     Review of Systems  Constitutional: Negative for unexpected weight change.  Gastrointestinal: Negative for diarrhea and nausea.  Skin: Negative for rash.       Objective:   Physical Exam HENT:     Mouth/Throat:     Pharynx: No posterior oropharyngeal erythema.     Comments: Poor dentition Skin:    Findings: No rash.  Neurological:     General: No focal deficit present.     Mental Status: He is alert.  Psychiatric:        Mood and Affect: Mood normal.   SH: + tobacco        Assessment & Plan:

## 2020-02-17 LAB — T-HELPER CELL (CD4) - (RCID CLINIC ONLY)
CD4 % Helper T Cell: 32 % — ABNORMAL LOW (ref 33–65)
CD4 T Cell Abs: 1243 /uL (ref 400–1790)

## 2020-02-18 LAB — COMPLETE METABOLIC PANEL WITH GFR
AG Ratio: 1.1 (calc) (ref 1.0–2.5)
ALT: 51 U/L — ABNORMAL HIGH (ref 9–46)
AST: 43 U/L — ABNORMAL HIGH (ref 10–35)
Albumin: 4.1 g/dL (ref 3.6–5.1)
Alkaline phosphatase (APISO): 75 U/L (ref 35–144)
BUN: 25 mg/dL (ref 7–25)
CO2: 25 mmol/L (ref 20–32)
Calcium: 9.3 mg/dL (ref 8.6–10.3)
Chloride: 93 mmol/L — ABNORMAL LOW (ref 98–110)
Creat: 0.97 mg/dL (ref 0.70–1.33)
GFR, Est African American: 101 mL/min/{1.73_m2} (ref >=60)
GFR, Est Non African American: 87 mL/min/{1.73_m2} (ref >=60)
Globulin: 3.9 g/dL (calc) — ABNORMAL HIGH (ref 1.9–3.7)
Glucose, Bld: 359 mg/dL — ABNORMAL HIGH (ref 65–99)
Potassium: 4.7 mmol/L (ref 3.5–5.3)
Sodium: 128 mmol/L — ABNORMAL LOW (ref 135–146)
Total Bilirubin: 0.3 mg/dL (ref 0.2–1.2)
Total Protein: 8 g/dL (ref 6.1–8.1)

## 2020-02-18 LAB — HIV-1 RNA QUANT-NO REFLEX-BLD
HIV 1 RNA Quant: 39 Copies/mL — ABNORMAL HIGH
HIV-1 RNA Quant, Log: 1.59 Log cps/mL — ABNORMAL HIGH

## 2020-03-08 MED FILL — BIKTARVY 50-200-25 MG TABS: 50-200-25 | 30 days supply | Qty: 30 | Fill #4

## 2020-04-01 MED FILL — BIKTARVY 50-200-25 MG TABS: 50-200-25 | 30 days supply | Qty: 30 | Fill #5

## 2020-04-29 ENCOUNTER — Other Ambulatory Visit: Payer: Self-pay | Admitting: Internal Medicine

## 2020-04-29 ENCOUNTER — Other Ambulatory Visit (HOSPITAL_COMMUNITY): Payer: Self-pay | Admitting: Internal Medicine

## 2020-04-29 DIAGNOSIS — B20 Human immunodeficiency virus [HIV] disease: Secondary | ICD-10-CM

## 2020-04-29 MED FILL — BIKTARVY 50-200-25 MG TABS: 50-200-25 | 30 days supply | Qty: 30 | Fill #0

## 2020-05-27 MED FILL — BIKTARVY 50-200-25 MG TABS: 50-200-25 | 30 days supply | Qty: 30 | Fill #1

## 2020-06-17 ENCOUNTER — Other Ambulatory Visit (HOSPITAL_COMMUNITY): Payer: Self-pay

## 2020-06-24 ENCOUNTER — Other Ambulatory Visit (HOSPITAL_COMMUNITY): Payer: Self-pay

## 2020-06-24 MED FILL — Bictegravir-Emtricitabine-Tenofovir AF Tab 50-200-25 MG: ORAL | 30 days supply | Qty: 30 | Fill #0 | Status: AC

## 2020-06-28 ENCOUNTER — Other Ambulatory Visit (HOSPITAL_COMMUNITY): Payer: Self-pay

## 2020-07-20 ENCOUNTER — Ambulatory Visit: Payer: 59 | Admitting: Internal Medicine

## 2020-07-22 ENCOUNTER — Other Ambulatory Visit: Payer: Self-pay | Admitting: Internal Medicine

## 2020-07-22 ENCOUNTER — Other Ambulatory Visit (HOSPITAL_COMMUNITY): Payer: Self-pay

## 2020-07-22 MED ORDER — BIKTARVY 50-200-25 MG PO TABS
1.0000 | ORAL_TABLET | Freq: Every day | ORAL | 0 refills | Status: DC
  Filled 2020-07-22: qty 30, 30d supply, fill #0

## 2020-07-30 ENCOUNTER — Ambulatory Visit: Payer: 59 | Admitting: Internal Medicine

## 2020-08-17 ENCOUNTER — Other Ambulatory Visit: Payer: Self-pay

## 2020-08-17 ENCOUNTER — Other Ambulatory Visit (HOSPITAL_COMMUNITY): Payer: Self-pay

## 2020-08-17 ENCOUNTER — Other Ambulatory Visit: Payer: Self-pay | Admitting: Internal Medicine

## 2020-08-17 MED ORDER — BIKTARVY 50-200-25 MG PO TABS
1.0000 | ORAL_TABLET | Freq: Every day | ORAL | 0 refills | Status: DC
  Filled 2020-08-17: qty 30, 30d supply, fill #0

## 2020-08-18 ENCOUNTER — Other Ambulatory Visit (HOSPITAL_COMMUNITY): Payer: Self-pay

## 2020-08-19 ENCOUNTER — Other Ambulatory Visit (HOSPITAL_COMMUNITY): Payer: Self-pay

## 2020-09-21 ENCOUNTER — Ambulatory Visit (INDEPENDENT_AMBULATORY_CARE_PROVIDER_SITE_OTHER): Payer: 59 | Admitting: Internal Medicine

## 2020-09-21 ENCOUNTER — Ambulatory Visit (INDEPENDENT_AMBULATORY_CARE_PROVIDER_SITE_OTHER): Payer: 59

## 2020-09-21 ENCOUNTER — Other Ambulatory Visit (HOSPITAL_COMMUNITY)
Admission: RE | Admit: 2020-09-21 | Discharge: 2020-09-21 | Disposition: A | Discharge status: Home / Self Care | Payer: 59 | Source: Ambulatory Visit | Attending: Internal Medicine | Admitting: Internal Medicine

## 2020-09-21 ENCOUNTER — Other Ambulatory Visit: Payer: Self-pay

## 2020-09-21 ENCOUNTER — Encounter: Payer: Self-pay | Admitting: Internal Medicine

## 2020-09-21 ENCOUNTER — Other Ambulatory Visit (HOSPITAL_COMMUNITY): Payer: Self-pay

## 2020-09-21 VITALS — BP 134/74 | HR 96 | Temp 98.0°F | Wt 209.0 lb

## 2020-09-21 DIAGNOSIS — Z23 Encounter for immunization: Secondary | ICD-10-CM

## 2020-09-21 DIAGNOSIS — B20 Human immunodeficiency virus [HIV] disease: Secondary | ICD-10-CM | POA: Diagnosis not present

## 2020-09-21 DIAGNOSIS — Z79899 Other long term (current) drug therapy: Secondary | ICD-10-CM

## 2020-09-21 DIAGNOSIS — Z720 Tobacco use: Secondary | ICD-10-CM | POA: Diagnosis not present

## 2020-09-21 DIAGNOSIS — R7401 Elevation of levels of liver transaminase levels: Secondary | ICD-10-CM | POA: Diagnosis not present

## 2020-09-21 DIAGNOSIS — Z113 Encounter for screening for infections with a predominantly sexual mode of transmission: Secondary | ICD-10-CM | POA: Diagnosis not present

## 2020-09-21 DIAGNOSIS — E119 Type 2 diabetes mellitus without complications: Secondary | ICD-10-CM

## 2020-09-21 NOTE — Assessment & Plan Note (Signed)
I reminded him of the need to quit smoking.  He remains precontemplative.

## 2020-09-21 NOTE — Assessment & Plan Note (Signed)
He is doing well from an HIV standpoint with no concerns.  Biktarvy seems to be good and no indication to change, though will check his labs today.  rtc in 6 months

## 2020-09-21 NOTE — Progress Notes (Signed)
   Covid-19 Vaccination Clinic  Name:  [name]    MRN: [id number] DOB: November 03, 1965  09/21/2020  Steven Collins was observed post Covid-19 immunization for 15 minutes without incident. He was provided with Vaccine Information Sheet and instruction to access the V-Safe system.   Steven Collins was instructed to call 911 with any severe reactions post vaccine: Difficulty breathing  Swelling of face and throat  A fast heartbeat  A bad rash all over body  Dizziness and weakness   Immunizations Administered     Name Date Dose VIS Date Route   PFIZER Comrnaty(Gray TOP) Covid-19 Vaccine 09/21/2020  2:37 PM 0.3 mL 02/26/2020 Intramuscular   Manufacturer: [employer], [employer]   Lot: [id number]   NDC: [address]-6     Juanita Laster, RMA

## 2020-09-21 NOTE — Assessment & Plan Note (Signed)
I reminded him of his hyperglycemia and diabetes and he is aware and I emphasized the need to return to his PCP asap, reduce sugar and increase exercise.

## 2020-09-21 NOTE — Assessment & Plan Note (Signed)
Mild persistent elevation.  Will recheck today.  I suspect some element of fatty liver.

## 2020-09-21 NOTE — Assessment & Plan Note (Signed)
Will screen 

## 2020-09-21 NOTE — Progress Notes (Signed)
   Subjective:    Patient ID: Steven Collins, male    DOB: 1965-08-08, 55 y.o.   MRN: [id number]  HPI Here for follow up of HIV He continues on Biktarvy with no missed doses.  He was previously changed from [place] and Descovy and no issues with the medication.  His viral load though since changing has been up a bit and was up to 140 last year but repeat in November was 39.  His blood sugar remains elevated and he is aware of that and previously was on treatment for diabetes.  Eats a lot of ice cream at night after work.  His LFTs have remained minimally elevated as well.  Continues to have cramping of his legs at night on days he works.     Review of Systems  Constitutional:  Negative for fatigue and unexpected weight change.  Gastrointestinal:  Negative for diarrhea and nausea.  Skin:  Negative for rash.      Objective:   Physical Exam Eyes:     General: No scleral icterus. Pulmonary:     Effort: Pulmonary effort is normal.  Skin:    Findings: No rash.  Neurological:     General: No focal deficit present.     Mental Status: He is alert.  Psychiatric:        Mood and Affect: Mood normal.  SH: + tobacco        Assessment & Plan:

## 2020-09-22 LAB — URINE CYTOLOGY ANCILLARY ONLY
Chlamydia: NEGATIVE
Comment: NEGATIVE
Comment: NORMAL
Neisseria Gonorrhea: NEGATIVE

## 2020-09-22 LAB — T-HELPER CELL (CD4) - (RCID CLINIC ONLY)
CD4 % Helper T Cell: 34 % (ref 33–65)
CD4 T Cell Abs: 1241 /uL (ref 400–1790)

## 2020-09-23 LAB — COMPLETE METABOLIC PANEL WITH GFR
AG Ratio: 1.1 (calc) (ref 1.0–2.5)
ALT: 90 U/L — ABNORMAL HIGH (ref 9–46)
AST: 97 U/L — ABNORMAL HIGH (ref 10–35)
Albumin: 4.1 g/dL (ref 3.6–5.1)
Alkaline phosphatase (APISO): 81 U/L (ref 35–144)
BUN: 19 mg/dL (ref 7–25)
CO2: 24 mmol/L (ref 20–32)
Calcium: 9.5 mg/dL (ref 8.6–10.3)
Chloride: 98 mmol/L (ref 98–110)
Creat: 0.84 mg/dL (ref 0.70–1.33)
GFR, Est African American: 113 mL/min/{1.73_m2} (ref >=60)
GFR, Est Non African American: 97 mL/min/{1.73_m2} (ref >=60)
Globulin: 3.6 g/dL (calc) (ref 1.9–3.7)
Glucose, Bld: 248 mg/dL — ABNORMAL HIGH (ref 65–99)
Potassium: 4.5 mmol/L (ref 3.5–5.3)
Sodium: 132 mmol/L — ABNORMAL LOW (ref 135–146)
Total Bilirubin: 0.4 mg/dL (ref 0.2–1.2)
Total Protein: 7.7 g/dL (ref 6.1–8.1)

## 2020-09-23 LAB — LIPID PANEL
Cholesterol: 247 mg/dL — ABNORMAL HIGH (ref <200)
HDL: 33 mg/dL — ABNORMAL LOW (ref >=40)
LDL Cholesterol (Calc): 160 mg/dL (calc) — ABNORMAL HIGH
Non-HDL Cholesterol (Calc): 214 mg/dL (calc) — ABNORMAL HIGH (ref <130)
Total CHOL/HDL Ratio: 7.5 (calc) — ABNORMAL HIGH (ref <5.0)
Triglycerides: 353 mg/dL — ABNORMAL HIGH (ref <150)

## 2020-09-23 LAB — HIV-1 RNA QUANT-NO REFLEX-BLD
HIV 1 RNA Quant: <20 Copies/mL — ABNORMAL HIGH
HIV-1 RNA Quant, Log: <1.3 Log cps/mL — ABNORMAL HIGH

## 2020-09-23 LAB — RPR: RPR Ser Ql: NONREACTIVE

## 2020-09-24 ENCOUNTER — Other Ambulatory Visit (HOSPITAL_COMMUNITY): Payer: Self-pay

## 2020-10-05 ENCOUNTER — Other Ambulatory Visit: Payer: Self-pay | Admitting: Internal Medicine

## 2020-10-05 ENCOUNTER — Other Ambulatory Visit (HOSPITAL_COMMUNITY): Payer: Self-pay

## 2020-10-06 ENCOUNTER — Other Ambulatory Visit (HOSPITAL_COMMUNITY): Payer: Self-pay

## 2020-10-06 MED ORDER — BIKTARVY 50-200-25 MG PO TABS
1.0000 | ORAL_TABLET | Freq: Every day | ORAL | 5 refills | Status: DC
  Filled 2020-10-06: qty 30, 30d supply, fill #0
  Filled 2020-11-09: qty 30, 30d supply, fill #1
  Filled 2020-12-08: qty 30, 30d supply, fill #2
  Filled 2021-01-07: qty 30, 30d supply, fill #3
  Filled 2021-02-08: qty 30, 30d supply, fill #4
  Filled 2021-03-10: qty 30, 30d supply, fill #5

## 2020-10-28 ENCOUNTER — Other Ambulatory Visit (HOSPITAL_COMMUNITY): Payer: Self-pay

## 2020-11-03 ENCOUNTER — Other Ambulatory Visit (HOSPITAL_COMMUNITY): Payer: Self-pay

## 2020-11-09 ENCOUNTER — Other Ambulatory Visit (HOSPITAL_COMMUNITY): Payer: Self-pay

## 2020-11-10 ENCOUNTER — Other Ambulatory Visit (HOSPITAL_COMMUNITY): Payer: Self-pay

## 2020-12-06 ENCOUNTER — Other Ambulatory Visit (HOSPITAL_COMMUNITY): Payer: Self-pay

## 2020-12-08 ENCOUNTER — Other Ambulatory Visit (HOSPITAL_COMMUNITY): Payer: Self-pay

## 2021-01-07 ENCOUNTER — Other Ambulatory Visit (HOSPITAL_COMMUNITY): Payer: Self-pay

## 2021-02-03 ENCOUNTER — Other Ambulatory Visit (HOSPITAL_COMMUNITY): Payer: Self-pay

## 2021-02-07 ENCOUNTER — Other Ambulatory Visit (HOSPITAL_COMMUNITY): Payer: Self-pay

## 2021-02-08 ENCOUNTER — Other Ambulatory Visit (HOSPITAL_COMMUNITY): Payer: Self-pay

## 2021-03-07 ENCOUNTER — Other Ambulatory Visit (HOSPITAL_COMMUNITY): Payer: Self-pay

## 2021-03-10 ENCOUNTER — Other Ambulatory Visit (HOSPITAL_COMMUNITY): Payer: Self-pay

## 2021-03-24 ENCOUNTER — Ambulatory Visit: Payer: 59 | Admitting: Internal Medicine

## 2021-04-04 ENCOUNTER — Other Ambulatory Visit: Payer: Self-pay | Admitting: Internal Medicine

## 2021-04-04 ENCOUNTER — Other Ambulatory Visit (HOSPITAL_COMMUNITY): Payer: Self-pay

## 2021-04-05 ENCOUNTER — Other Ambulatory Visit (HOSPITAL_COMMUNITY): Payer: Self-pay

## 2021-04-05 MED ORDER — BIKTARVY 50-200-25 MG PO TABS
1.0000 | ORAL_TABLET | Freq: Every day | ORAL | 2 refills | Status: DC
  Filled 2021-04-05: qty 30, 30d supply, fill #0
  Filled 2021-05-02: qty 30, 30d supply, fill #1
  Filled 2021-05-30 (×2): qty 30, 30d supply, fill #2

## 2021-04-07 ENCOUNTER — Other Ambulatory Visit (HOSPITAL_COMMUNITY): Payer: Self-pay

## 2021-05-02 ENCOUNTER — Other Ambulatory Visit (HOSPITAL_COMMUNITY): Payer: Self-pay

## 2021-05-03 ENCOUNTER — Other Ambulatory Visit: Payer: Self-pay

## 2021-05-03 ENCOUNTER — Encounter: Payer: Self-pay | Admitting: Internal Medicine

## 2021-05-03 ENCOUNTER — Ambulatory Visit (INDEPENDENT_AMBULATORY_CARE_PROVIDER_SITE_OTHER): Payer: 59 | Admitting: Internal Medicine

## 2021-05-03 VITALS — BP 145/79 | HR 84 | Resp 16 | Ht 65.0 in | Wt 209.0 lb

## 2021-05-03 DIAGNOSIS — B20 Human immunodeficiency virus [HIV] disease: Secondary | ICD-10-CM

## 2021-05-03 DIAGNOSIS — Z720 Tobacco use: Secondary | ICD-10-CM

## 2021-05-03 DIAGNOSIS — E119 Type 2 diabetes mellitus without complications: Secondary | ICD-10-CM | POA: Diagnosis not present

## 2021-05-03 NOTE — Assessment & Plan Note (Signed)
Discussed cessation.  He is precontemplative.

## 2021-05-03 NOTE — Assessment & Plan Note (Signed)
I again discussed with him the need to care for his diabetes and importance of management including renal, eye and other issues.  He voiced his understanding but not interested at this time.  Poorly controlled.  I have recommended he follow up with a PCP

## 2021-05-03 NOTE — Progress Notes (Signed)
° °  Subjective:    Patient ID: Steven Collins, male    DOB: 1965/08/03, 56 y.o.   MRN: [id number]  HPI Here for follow up of HIV He continues on Biktarvy and denies any missed doses.  No new issues.  No issues with getting, taking or tolerating the medication.  He was briefly in care for his diabetes and started medication but did not follow up.     Review of Systems  Constitutional:  Negative for fatigue and unexpected weight change.  Gastrointestinal:  Negative for diarrhea and nausea.  Skin:  Negative for rash.      Objective:   Physical Exam Eyes:     General: No scleral icterus. Pulmonary:     Effort: Pulmonary effort is normal.  Skin:    Findings: No rash.  Neurological:     General: No focal deficit present.     Mental Status: He is alert.  Psychiatric:        Mood and Affect: Mood normal.    SH: + tobacco      Assessment & Plan:

## 2021-05-03 NOTE — Assessment & Plan Note (Signed)
He seems to be doing well with his HIV and no concerns.  Will confirm with his labs today and he can rtc in 6 months.

## 2021-05-05 ENCOUNTER — Other Ambulatory Visit (HOSPITAL_COMMUNITY): Payer: Self-pay

## 2021-05-05 LAB — HIV-1 RNA QUANT-NO REFLEX-BLD
HIV 1 RNA Quant: 79 Copies/mL — ABNORMAL HIGH
HIV-1 RNA Quant, Log: 1.9 Log cps/mL — ABNORMAL HIGH

## 2021-05-05 LAB — T-HELPER CELLS (CD4) COUNT (NOT AT ARMC)
Absolute CD4: 1653 cells/uL (ref 490–1740)
CD4 T Helper %: 35 % (ref 30–61)
Total lymphocyte count: 4674 cells/uL — ABNORMAL HIGH (ref 850–3900)

## 2021-05-30 ENCOUNTER — Other Ambulatory Visit (HOSPITAL_COMMUNITY): Payer: Self-pay

## 2021-06-01 ENCOUNTER — Other Ambulatory Visit (HOSPITAL_COMMUNITY): Payer: Self-pay

## 2021-06-23 ENCOUNTER — Other Ambulatory Visit (HOSPITAL_COMMUNITY): Payer: Self-pay

## 2021-06-29 ENCOUNTER — Other Ambulatory Visit (HOSPITAL_COMMUNITY): Payer: Self-pay

## 2021-07-05 ENCOUNTER — Other Ambulatory Visit (HOSPITAL_COMMUNITY): Payer: Self-pay

## 2021-07-05 ENCOUNTER — Other Ambulatory Visit: Payer: Self-pay | Admitting: Internal Medicine

## 2021-07-05 MED ORDER — BIKTARVY 50-200-25 MG PO TABS
1.0000 | ORAL_TABLET | Freq: Every day | ORAL | 5 refills | Status: DC
  Filled 2021-07-05: qty 30, 30d supply, fill #0
  Filled 2021-07-28: qty 30, 30d supply, fill #1
  Filled 2021-08-24: qty 30, 30d supply, fill #2
  Filled 2021-09-19: qty 30, 30d supply, fill #3
  Filled 2021-10-20: qty 30, 30d supply, fill #4
  Filled 2021-11-23: qty 30, 30d supply, fill #5

## 2021-07-07 ENCOUNTER — Other Ambulatory Visit (HOSPITAL_COMMUNITY): Payer: Self-pay

## 2021-07-26 ENCOUNTER — Other Ambulatory Visit (HOSPITAL_COMMUNITY): Payer: Self-pay

## 2021-07-28 ENCOUNTER — Other Ambulatory Visit (HOSPITAL_COMMUNITY): Payer: Self-pay

## 2021-08-01 ENCOUNTER — Other Ambulatory Visit (HOSPITAL_COMMUNITY): Payer: Self-pay

## 2021-08-02 ENCOUNTER — Other Ambulatory Visit (HOSPITAL_COMMUNITY): Payer: Self-pay

## 2021-08-23 ENCOUNTER — Other Ambulatory Visit (HOSPITAL_COMMUNITY): Payer: Self-pay

## 2021-08-24 ENCOUNTER — Other Ambulatory Visit (HOSPITAL_COMMUNITY): Payer: Self-pay

## 2021-08-30 ENCOUNTER — Other Ambulatory Visit (HOSPITAL_COMMUNITY): Payer: Self-pay

## 2021-09-15 ENCOUNTER — Other Ambulatory Visit (HOSPITAL_COMMUNITY): Payer: Self-pay

## 2021-09-19 ENCOUNTER — Other Ambulatory Visit (HOSPITAL_COMMUNITY): Payer: Self-pay

## 2021-09-23 ENCOUNTER — Other Ambulatory Visit (HOSPITAL_COMMUNITY): Payer: Self-pay

## 2021-09-29 ENCOUNTER — Other Ambulatory Visit (HOSPITAL_COMMUNITY): Payer: Self-pay

## 2021-10-20 ENCOUNTER — Other Ambulatory Visit (HOSPITAL_COMMUNITY): Payer: Self-pay

## 2021-10-24 ENCOUNTER — Other Ambulatory Visit (HOSPITAL_COMMUNITY): Payer: Self-pay

## 2021-11-02 ENCOUNTER — Ambulatory Visit: Payer: 59 | Admitting: Internal Medicine

## 2021-11-02 ENCOUNTER — Telehealth: Payer: Self-pay

## 2021-11-02 NOTE — Telephone Encounter (Signed)
Called patient to reschedule missed appointment. No answer/ mobile phone number no longer in service. Called home phone number and call was answered by an individual who stated she was patient's daughter, and patient was not at home. Stated we would attempt to reach out at another time.  Wyvonne Lenz, RN

## 2021-11-18 ENCOUNTER — Other Ambulatory Visit (HOSPITAL_COMMUNITY): Payer: Self-pay

## 2021-11-22 ENCOUNTER — Other Ambulatory Visit (HOSPITAL_COMMUNITY): Payer: Self-pay

## 2021-11-23 ENCOUNTER — Other Ambulatory Visit (HOSPITAL_COMMUNITY): Payer: Self-pay

## 2021-11-25 ENCOUNTER — Other Ambulatory Visit (HOSPITAL_COMMUNITY): Payer: Self-pay

## 2021-12-13 ENCOUNTER — Ambulatory Visit (INDEPENDENT_AMBULATORY_CARE_PROVIDER_SITE_OTHER): Payer: 59 | Admitting: Internal Medicine

## 2021-12-13 ENCOUNTER — Encounter: Payer: Self-pay | Admitting: Internal Medicine

## 2021-12-13 ENCOUNTER — Other Ambulatory Visit (HOSPITAL_COMMUNITY): Payer: Self-pay

## 2021-12-13 ENCOUNTER — Other Ambulatory Visit: Payer: Self-pay

## 2021-12-13 ENCOUNTER — Other Ambulatory Visit (HOSPITAL_COMMUNITY)
Admission: RE | Admit: 2021-12-13 | Discharge: 2021-12-13 | Disposition: A | Discharge status: Home / Self Care | Payer: 59 | Source: Ambulatory Visit | Attending: Internal Medicine | Admitting: Internal Medicine

## 2021-12-13 VITALS — BP 150/83 | HR 89 | Temp 97.4°F | Wt 206.0 lb

## 2021-12-13 DIAGNOSIS — Z79899 Other long term (current) drug therapy: Secondary | ICD-10-CM

## 2021-12-13 DIAGNOSIS — B20 Human immunodeficiency virus [HIV] disease: Secondary | ICD-10-CM

## 2021-12-13 DIAGNOSIS — Z720 Tobacco use: Secondary | ICD-10-CM

## 2021-12-13 DIAGNOSIS — Z113 Encounter for screening for infections with a predominantly sexual mode of transmission: Secondary | ICD-10-CM | POA: Diagnosis present

## 2021-12-13 DIAGNOSIS — R7401 Elevation of levels of liver transaminase levels: Secondary | ICD-10-CM

## 2021-12-13 MED ORDER — BIKTARVY 50-200-25 MG PO TABS
1.0000 | ORAL_TABLET | Freq: Every day | ORAL | 11 refills | Status: DC
  Filled 2021-12-13 – 2021-12-27 (×2): qty 30, 30d supply, fill #0
  Filled 2022-01-30: qty 30, 30d supply, fill #1
  Filled 2022-02-21 – 2022-02-24 (×2): qty 30, 30d supply, fill #2
  Filled 2022-03-22: qty 30, 30d supply, fill #3
  Filled 2022-04-21: qty 30, 30d supply, fill #4
  Filled 2022-05-30: qty 30, 30d supply, fill #5
  Filled 2022-06-21: qty 30, 30d supply, fill #6
  Filled 2022-07-19: qty 30, 30d supply, fill #7
  Filled 2022-08-15: qty 30, 30d supply, fill #8
  Filled 2022-09-18: qty 30, 30d supply, fill #9
  Filled 2022-10-24 – 2022-10-26 (×2): qty 30, 30d supply, fill #10
  Filled 2022-11-27: qty 30, 30d supply, fill #11

## 2021-12-13 NOTE — Assessment & Plan Note (Signed)
Will check his lipid panel 

## 2021-12-13 NOTE — Progress Notes (Signed)
   Subjective:    Patient ID: Steven Collins, male    DOB: May 01, 1965, 56 y.o.   MRN: [id number]  HPI Here for follow up of HIV He continues on Biktarvy and denies any missed doses.  He has had no issues with getting, taking or tolerating Biktarvy.     Review of Systems  Constitutional:  Negative for fatigue.  Gastrointestinal:  Negative for diarrhea.  Skin:  Negative for rash.       Objective:   Physical Exam Eyes:     General: No scleral icterus. Pulmonary:     Effort: Pulmonary effort is normal.  Skin:    Findings: No rash.  Neurological:     Mental Status: He is alert.  Psychiatric:        Mood and Affect: Mood normal.   SH: + tobacco        Assessment & Plan:

## 2021-12-13 NOTE — Assessment & Plan Note (Signed)
Discussed cessation 

## 2021-12-13 NOTE — Assessment & Plan Note (Signed)
Will recheck today.  No clear reason, may be some fatty liver.  Will continue to monitor.

## 2021-12-13 NOTE — Assessment & Plan Note (Signed)
He continues to do well on [place] and will confirm with labs today.  If no issues, he will return in 6 months.  Refills provided.

## 2021-12-13 NOTE — Assessment & Plan Note (Signed)
Will screen, low risk 

## 2021-12-14 LAB — URINE CYTOLOGY ANCILLARY ONLY
Chlamydia: NEGATIVE
Comment: NEGATIVE
Comment: NORMAL
Neisseria Gonorrhea: NEGATIVE

## 2021-12-14 LAB — T-HELPER CELL (CD4) - (RCID CLINIC ONLY)
CD4 % Helper T Cell: 36 % (ref 33–65)
CD4 T Cell Abs: 1392 /uL (ref 400–1790)

## 2021-12-15 LAB — CBC WITH DIFFERENTIAL/PLATELET
Absolute Monocytes: 708 cells/uL (ref 200–950)
Basophils Absolute: 151 cells/uL (ref 0–200)
Basophils Relative: 1.3 %
Eosinophils Absolute: 267 cells/uL (ref 15–500)
Eosinophils Relative: 2.3 %
HCT: 46.8 % (ref 38.5–50.0)
Hemoglobin: 16.2 g/dL (ref 13.2–17.1)
Lymphs Abs: 4199 cells/uL — ABNORMAL HIGH (ref 850–3900)
MCH: 30.5 pg (ref 27.0–33.0)
MCHC: 34.6 g/dL (ref 32.0–36.0)
MCV: 88 fL (ref 80.0–100.0)
MPV: 9.8 fL (ref 7.5–12.5)
Monocytes Relative: 6.1 %
Neutro Abs: 6276 cells/uL (ref 1500–7800)
Neutrophils Relative %: 54.1 %
Platelets: 272 10*3/uL (ref 140–400)
RBC: 5.32 10*6/uL (ref 4.20–5.80)
RDW: 12.5 % (ref 11.0–15.0)
Total Lymphocyte: 36.2 %
WBC: 11.6 10*3/uL — ABNORMAL HIGH (ref 3.8–10.8)

## 2021-12-15 LAB — COMPLETE METABOLIC PANEL WITH GFR
AG Ratio: 1.2 (calc) (ref 1.0–2.5)
ALT: 24 U/L (ref 9–46)
AST: 13 U/L (ref 10–35)
Albumin: 4.2 g/dL (ref 3.6–5.1)
Alkaline phosphatase (APISO): 72 U/L (ref 35–144)
BUN: 13 mg/dL (ref 7–25)
CO2: 27 mmol/L (ref 20–32)
Calcium: 9.5 mg/dL (ref 8.6–10.3)
Chloride: 97 mmol/L — ABNORMAL LOW (ref 98–110)
Creat: 0.78 mg/dL (ref 0.70–1.30)
Globulin: 3.5 g/dL (calc) (ref 1.9–3.7)
Glucose, Bld: 205 mg/dL — ABNORMAL HIGH (ref 65–99)
Potassium: 4.5 mmol/L (ref 3.5–5.3)
Sodium: 132 mmol/L — ABNORMAL LOW (ref 135–146)
Total Bilirubin: 0.4 mg/dL (ref 0.2–1.2)
Total Protein: 7.7 g/dL (ref 6.1–8.1)
eGFR: 103 mL/min/{1.73_m2} (ref >=60)

## 2021-12-15 LAB — LIPID PANEL
Cholesterol: 232 mg/dL — ABNORMAL HIGH (ref <200)
HDL: 38 mg/dL — ABNORMAL LOW (ref >=40)
LDL Cholesterol (Calc): 147 mg/dL (calc) — ABNORMAL HIGH
Non-HDL Cholesterol (Calc): 194 mg/dL (calc) — ABNORMAL HIGH (ref <130)
Total CHOL/HDL Ratio: 6.1 (calc) — ABNORMAL HIGH (ref <5.0)
Triglycerides: 286 mg/dL — ABNORMAL HIGH (ref <150)

## 2021-12-15 LAB — RPR: RPR Ser Ql: NONREACTIVE

## 2021-12-15 LAB — HIV-1 RNA QUANT-NO REFLEX-BLD
HIV 1 RNA Quant: <20 Copies/mL — ABNORMAL HIGH
HIV-1 RNA Quant, Log: <1.3 Log cps/mL — ABNORMAL HIGH

## 2021-12-22 ENCOUNTER — Other Ambulatory Visit (HOSPITAL_COMMUNITY): Payer: Self-pay

## 2021-12-26 ENCOUNTER — Other Ambulatory Visit (HOSPITAL_COMMUNITY): Payer: Self-pay

## 2021-12-27 ENCOUNTER — Other Ambulatory Visit (HOSPITAL_COMMUNITY): Payer: Self-pay

## 2021-12-29 ENCOUNTER — Other Ambulatory Visit (HOSPITAL_COMMUNITY): Payer: Self-pay

## 2022-01-20 ENCOUNTER — Other Ambulatory Visit (HOSPITAL_COMMUNITY): Payer: Self-pay

## 2022-01-23 ENCOUNTER — Other Ambulatory Visit (HOSPITAL_COMMUNITY): Payer: Self-pay

## 2022-01-30 ENCOUNTER — Other Ambulatory Visit (HOSPITAL_COMMUNITY): Payer: Self-pay

## 2022-02-02 ENCOUNTER — Other Ambulatory Visit (HOSPITAL_COMMUNITY): Payer: Self-pay

## 2022-02-21 ENCOUNTER — Other Ambulatory Visit (HOSPITAL_COMMUNITY): Payer: Self-pay

## 2022-02-24 ENCOUNTER — Other Ambulatory Visit (HOSPITAL_COMMUNITY): Payer: Self-pay

## 2022-02-27 ENCOUNTER — Other Ambulatory Visit: Payer: Self-pay

## 2022-03-22 ENCOUNTER — Other Ambulatory Visit (HOSPITAL_COMMUNITY): Payer: Self-pay

## 2022-03-22 ENCOUNTER — Other Ambulatory Visit: Payer: Self-pay

## 2022-03-27 ENCOUNTER — Other Ambulatory Visit: Payer: Self-pay

## 2022-04-19 ENCOUNTER — Other Ambulatory Visit (HOSPITAL_COMMUNITY): Payer: Self-pay

## 2022-04-21 ENCOUNTER — Other Ambulatory Visit (HOSPITAL_COMMUNITY): Payer: Self-pay

## 2022-04-26 ENCOUNTER — Other Ambulatory Visit: Payer: Self-pay

## 2022-05-30 ENCOUNTER — Other Ambulatory Visit (HOSPITAL_COMMUNITY): Payer: Self-pay

## 2022-06-13 ENCOUNTER — Ambulatory Visit: Payer: 59 | Admitting: Internal Medicine

## 2022-06-21 ENCOUNTER — Other Ambulatory Visit (HOSPITAL_COMMUNITY): Payer: Self-pay

## 2022-06-26 ENCOUNTER — Other Ambulatory Visit: Payer: Self-pay

## 2022-06-27 ENCOUNTER — Ambulatory Visit: Payer: 59 | Admitting: Internal Medicine

## 2022-06-27 ENCOUNTER — Telehealth: Payer: Self-pay

## 2022-06-27 NOTE — Telephone Encounter (Signed)
Called patient to reschedule today's missed appointment. Call could not be completed on mobile number. No answer on home number and voicemail greeting did not match patient's name, did not leave voicemail.   Sandie Ano, RN

## 2022-07-19 ENCOUNTER — Other Ambulatory Visit (HOSPITAL_COMMUNITY): Payer: Self-pay

## 2022-07-21 ENCOUNTER — Other Ambulatory Visit (HOSPITAL_COMMUNITY): Payer: Self-pay

## 2022-08-15 ENCOUNTER — Other Ambulatory Visit: Payer: Self-pay

## 2022-08-16 ENCOUNTER — Other Ambulatory Visit: Payer: Self-pay

## 2022-08-16 ENCOUNTER — Ambulatory Visit (INDEPENDENT_AMBULATORY_CARE_PROVIDER_SITE_OTHER): Payer: 59 | Admitting: Internal Medicine

## 2022-08-16 ENCOUNTER — Encounter: Payer: Self-pay | Admitting: Internal Medicine

## 2022-08-16 VITALS — BP 133/79 | HR 91 | Temp 97.7°F | Ht 64.0 in | Wt 203.0 lb

## 2022-08-16 DIAGNOSIS — B20 Human immunodeficiency virus [HIV] disease: Secondary | ICD-10-CM

## 2022-08-16 DIAGNOSIS — Z720 Tobacco use: Secondary | ICD-10-CM

## 2022-08-16 DIAGNOSIS — R7401 Elevation of levels of liver transaminase levels: Secondary | ICD-10-CM | POA: Diagnosis not present

## 2022-08-16 DIAGNOSIS — E119 Type 2 diabetes mellitus without complications: Secondary | ICD-10-CM | POA: Diagnosis not present

## 2022-08-16 DIAGNOSIS — Z113 Encounter for screening for infections with a predominantly sexual mode of transmission: Secondary | ICD-10-CM

## 2022-08-16 DIAGNOSIS — F17210 Nicotine dependence, cigarettes, uncomplicated: Secondary | ICD-10-CM

## 2022-08-17 ENCOUNTER — Encounter: Payer: Self-pay | Admitting: Internal Medicine

## 2022-08-17 NOTE — Assessment & Plan Note (Signed)
LFTs last visit and checked this visit and are wnl.

## 2022-08-17 NOTE — Assessment & Plan Note (Signed)
High blood sugar and he needs to get established with a PCP

## 2022-08-17 NOTE — Assessment & Plan Note (Signed)
Will screen.  Low risk with stable partner.

## 2022-08-17 NOTE — Assessment & Plan Note (Signed)
Encouraged cessation and he remains precontemplative

## 2022-08-17 NOTE — Assessment & Plan Note (Signed)
He continues to do well with Biktavy and no changes indicated. Will check his routine labs and at this point, yearly follow up will be fine.

## 2022-08-17 NOTE — Progress Notes (Signed)
   Subjective:    Patient ID: Steven Collins, male    DOB: 04-30-1965, 57 y.o.   MRN: [id number]  HPI Steven Collins is here for follow up of HIV He continues on [place] with no issues with getting, taking or tolerating his medication.  His wife unfortunately his wife was in an accident and now with multiple fractures and back out of the hospital.   ROS: Constitutional: no fatigue      Objective:   Physical Exam Eyes:     General: No scleral icterus. Pulmonary:     Effort: Pulmonary effort is normal.  Neurological:     Mental Status: He is alert.   SH: + tobacco        Assessment & Plan:

## 2022-08-18 LAB — COMPLETE METABOLIC PANEL WITH GFR
AG Ratio: 1.2 (calc) (ref 1.0–2.5)
ALT: 23 U/L (ref 9–46)
AST: 25 U/L (ref 10–35)
Albumin: 4.2 g/dL (ref 3.6–5.1)
Alkaline phosphatase (APISO): 75 U/L (ref 35–144)
BUN: 15 mg/dL (ref 7–25)
CO2: 26 mmol/L (ref 20–32)
Calcium: 9.6 mg/dL (ref 8.6–10.3)
Chloride: 95 mmol/L — ABNORMAL LOW (ref 98–110)
Creat: 0.81 mg/dL (ref 0.70–1.30)
Globulin: 3.4 g/dL (calc) (ref 1.9–3.7)
Glucose, Bld: 154 mg/dL — ABNORMAL HIGH (ref 65–99)
Potassium: 4.9 mmol/L (ref 3.5–5.3)
Sodium: 131 mmol/L — ABNORMAL LOW (ref 135–146)
Total Bilirubin: 0.4 mg/dL (ref 0.2–1.2)
Total Protein: 7.6 g/dL (ref 6.1–8.1)
eGFR: 102 mL/min/{1.73_m2} (ref >=60)

## 2022-08-18 LAB — T-HELPER CELL (CD4) - (RCID CLINIC ONLY)
CD4 % Helper T Cell: 38 % (ref 33–65)
CD4 T Cell Abs: 1287 /uL (ref 400–1790)

## 2022-08-18 LAB — HIV-1 RNA QUANT-NO REFLEX-BLD
HIV 1 RNA Quant: <20 Copies/mL — ABNORMAL HIGH
HIV-1 RNA Quant, Log: <1.3 Log cps/mL — ABNORMAL HIGH

## 2022-08-22 ENCOUNTER — Other Ambulatory Visit (HOSPITAL_COMMUNITY): Payer: Self-pay

## 2022-09-13 ENCOUNTER — Other Ambulatory Visit: Payer: Self-pay

## 2022-09-15 ENCOUNTER — Other Ambulatory Visit: Payer: Self-pay

## 2022-09-18 ENCOUNTER — Other Ambulatory Visit: Payer: Self-pay

## 2022-09-19 ENCOUNTER — Other Ambulatory Visit (HOSPITAL_COMMUNITY): Payer: Self-pay

## 2022-10-13 ENCOUNTER — Other Ambulatory Visit (HOSPITAL_COMMUNITY): Payer: Self-pay

## 2022-10-25 ENCOUNTER — Other Ambulatory Visit (HOSPITAL_COMMUNITY): Payer: Self-pay

## 2022-10-26 ENCOUNTER — Other Ambulatory Visit (HOSPITAL_COMMUNITY): Payer: Self-pay

## 2022-10-27 ENCOUNTER — Other Ambulatory Visit (HOSPITAL_COMMUNITY): Payer: Self-pay

## 2022-11-27 ENCOUNTER — Other Ambulatory Visit (HOSPITAL_COMMUNITY): Payer: Self-pay

## 2022-11-28 ENCOUNTER — Other Ambulatory Visit (HOSPITAL_COMMUNITY): Payer: Self-pay

## 2022-12-20 ENCOUNTER — Other Ambulatory Visit (HOSPITAL_COMMUNITY): Payer: Self-pay

## 2022-12-22 ENCOUNTER — Other Ambulatory Visit (HOSPITAL_COMMUNITY): Payer: Self-pay

## 2022-12-25 ENCOUNTER — Other Ambulatory Visit: Payer: Self-pay

## 2022-12-25 ENCOUNTER — Other Ambulatory Visit: Payer: Self-pay | Admitting: Internal Medicine

## 2022-12-25 MED ORDER — BIKTARVY 50-200-25 MG PO TABS
1.0000 | ORAL_TABLET | Freq: Every day | ORAL | 8 refills | Status: DC
  Filled 2022-12-25: qty 30, 30d supply, fill #0
  Filled 2023-01-22: qty 30, 30d supply, fill #1
  Filled 2023-02-19: qty 30, 30d supply, fill #2
  Filled 2023-03-22: qty 30, 30d supply, fill #3
  Filled 2023-04-16: qty 30, 30d supply, fill #4
  Filled 2023-05-23: qty 30, 30d supply, fill #5
  Filled 2023-06-22: qty 30, 30d supply, fill #6
  Filled 2023-07-25 (×2): qty 30, 30d supply, fill #7
  Filled 2023-08-16: qty 30, 30d supply, fill #8

## 2022-12-25 NOTE — Progress Notes (Signed)
Specialty Pharmacy Refill Coordination Note  Steven Collins is a 57 y.o. male contacted today regarding refills of specialty medication(s) Bictegravir-Emtricitab-Tenofov   Patient requested Delivery   Delivery date: 12/28/22   Verified address: [address]   [place] [address]   Medication will be filled on 12/27/22 pending refill request.

## 2022-12-25 NOTE — Progress Notes (Signed)
Refill received

## 2023-01-19 ENCOUNTER — Other Ambulatory Visit: Payer: Self-pay

## 2023-01-22 ENCOUNTER — Other Ambulatory Visit: Payer: Self-pay

## 2023-01-22 NOTE — Progress Notes (Signed)
Specialty Pharmacy Refill Coordination Note  Steven Collins is a 57 y.o. male contacted today regarding refills of specialty medication(s) Bictegravir-Emtricitab-Tenofov   Patient requested Delivery   Delivery date: 01/26/23   Verified address: [address]   [place] [address]   Medication will be filled on 01/25/23.

## 2023-02-19 ENCOUNTER — Other Ambulatory Visit (HOSPITAL_COMMUNITY): Payer: Self-pay

## 2023-02-19 NOTE — Progress Notes (Signed)
Specialty Pharmacy Ongoing Clinical Assessment Note  HOWARD BRUER is a 57 y.o. male who is being followed by the specialty pharmacy service for RxSp HIV   Patient's specialty medication(s) reviewed today: Bictegravir-Emtricitab-Tenofov   Missed doses in the last 4 weeks: 0   Patient/Caregiver did not have any additional questions or concerns.   Therapeutic benefit summary: Patient is achieving benefit   Adverse events/side effects summary: No adverse events/side effects   Patient's therapy is appropriate to: Continue    Goals Addressed             This Visit's Progress    Achieve Undetectable HIV Viral Load < 20       Patient is on track. Patient will maintain adherence.  Viral load remains <20 copies/mL (08/16/22),         Follow up:  6 months  Servando Snare Specialty Pharmacist

## 2023-02-19 NOTE — Progress Notes (Signed)
Specialty Pharmacy Refill Coordination Note  Steven Collins is a 57 y.o. male contacted today regarding refills of specialty medication(s) Bictegravir-Emtricitab-Tenofov   Patient requested Delivery   Delivery date: 02/22/23   Verified address: [address] [place] [address]   Medication will be filled on 01/22/23.

## 2023-02-21 ENCOUNTER — Other Ambulatory Visit: Payer: Self-pay

## 2023-03-16 ENCOUNTER — Other Ambulatory Visit: Payer: Self-pay

## 2023-03-19 ENCOUNTER — Other Ambulatory Visit (HOSPITAL_COMMUNITY): Payer: Self-pay

## 2023-03-22 ENCOUNTER — Other Ambulatory Visit: Payer: Self-pay

## 2023-03-22 NOTE — Progress Notes (Signed)
 Specialty Pharmacy Refill Coordination Note  MARQUICE UDDIN is a 58 y.o. male contacted today regarding refills of specialty medication(s) Bictegravir-Emtricitab-Tenofov (Biktarvy )   Patient requested Delivery   Delivery date: 03/26/23   Verified address: [address]  [place] [address]   Medication will be filled on 03/23/23.

## 2023-04-11 ENCOUNTER — Other Ambulatory Visit: Payer: Self-pay

## 2023-04-13 ENCOUNTER — Other Ambulatory Visit: Payer: Self-pay

## 2023-04-16 ENCOUNTER — Other Ambulatory Visit (HOSPITAL_COMMUNITY): Payer: Self-pay

## 2023-04-16 ENCOUNTER — Other Ambulatory Visit: Payer: Self-pay

## 2023-04-16 NOTE — Progress Notes (Signed)
Specialty Pharmacy Refill Coordination Note  Steven Collins is a 57 y.o. male contacted today regarding refills of specialty medication(s) Bictegravir-Emtricitab-Tenofov Susanne Borders)   Patient requested Delivery   Delivery date: 04/19/23   Verified address: [address]  [place] [address]   Medication will be filled on 04/18/23.

## 2023-05-16 ENCOUNTER — Other Ambulatory Visit: Payer: Self-pay

## 2023-05-18 ENCOUNTER — Other Ambulatory Visit: Payer: Self-pay

## 2023-05-21 ENCOUNTER — Other Ambulatory Visit (HOSPITAL_COMMUNITY): Payer: Self-pay

## 2023-05-23 ENCOUNTER — Other Ambulatory Visit (HOSPITAL_COMMUNITY): Payer: Self-pay

## 2023-05-23 ENCOUNTER — Other Ambulatory Visit: Payer: Self-pay

## 2023-05-23 NOTE — Progress Notes (Signed)
 Specialty Pharmacy Refill Coordination Note  Steven Collins is a 58 y.o. male contacted today regarding refills of specialty medication(s) Bictegravir-Emtricitab-Tenofov Susanne Borders)   Patient requested Delivery   Delivery date: 05/24/23   Verified address: [address]   [place] [address]   Medication will be filled on 05/23/23.

## 2023-06-13 ENCOUNTER — Other Ambulatory Visit: Payer: Self-pay

## 2023-06-15 ENCOUNTER — Other Ambulatory Visit: Payer: Self-pay

## 2023-06-18 ENCOUNTER — Other Ambulatory Visit (HOSPITAL_COMMUNITY): Payer: Self-pay

## 2023-06-22 ENCOUNTER — Other Ambulatory Visit (HOSPITAL_COMMUNITY): Payer: Self-pay

## 2023-06-22 ENCOUNTER — Other Ambulatory Visit: Payer: Self-pay

## 2023-06-22 NOTE — Progress Notes (Signed)
 Specialty Pharmacy Refill Coordination Note  Steven Collins is a 58 y.o. male contacted today regarding refills of specialty medication(s) Bictegravir-Emtricitab-Tenofov Susanne Borders)   Patient requested Delivery   Delivery date: 06/25/23   Verified address: [address]   [place] [address]   Medication will be filled on 06/22/23.

## 2023-07-12 ENCOUNTER — Other Ambulatory Visit: Payer: Self-pay

## 2023-07-16 ENCOUNTER — Other Ambulatory Visit: Payer: Self-pay

## 2023-07-18 ENCOUNTER — Other Ambulatory Visit: Payer: Self-pay

## 2023-07-20 ENCOUNTER — Ambulatory Visit: Payer: 59 | Admitting: Internal Medicine

## 2023-07-24 ENCOUNTER — Ambulatory Visit: Payer: 59 | Admitting: Internal Medicine

## 2023-07-25 ENCOUNTER — Other Ambulatory Visit (HOSPITAL_COMMUNITY): Payer: Self-pay

## 2023-07-25 ENCOUNTER — Other Ambulatory Visit: Payer: Self-pay

## 2023-07-25 NOTE — Progress Notes (Signed)
 Specialty Pharmacy Refill Coordination Note  Steven Collins is a 58 y.o. male contacted today regarding refills of specialty medication(s) Bictegravir-Emtricitab-Tenofov (Biktarvy )   Patient requested Delivery   Delivery date: 07/27/23   Verified address: [address] [place] [address]   Medication will be filled on 07/26/23.

## 2023-08-01 ENCOUNTER — Ambulatory Visit: Payer: 59 | Admitting: Internal Medicine

## 2023-08-14 ENCOUNTER — Other Ambulatory Visit: Payer: Self-pay

## 2023-08-16 ENCOUNTER — Other Ambulatory Visit: Payer: Self-pay

## 2023-08-16 NOTE — Progress Notes (Signed)
 Specialty Pharmacy Ongoing Clinical Assessment Note  Steven Collins is a 58 y.o. male who is being followed by the specialty pharmacy service for RxSp HIV   Patient's specialty medication(s) reviewed today: Bictegravir-Emtricitab-Tenofov (Biktarvy )   Missed doses in the last 4 weeks: 0   Patient/Caregiver did not have any additional questions or concerns.   Therapeutic benefit summary: Patient is achieving benefit   Adverse events/side effects summary: No adverse events/side effects   Patient's therapy is appropriate to: Continue    Goals Addressed             This Visit's Progress    Achieve Undetectable HIV Viral Load < 20   On track    Patient is on track. Patient will maintain adherence.  Viral load remains <20 copies/mL (08/16/22),         Follow up: 6 months  [facility] Specialty Pharmacist

## 2023-08-16 NOTE — Progress Notes (Signed)
 Specialty Pharmacy Refill Coordination Note  Steven Collins is a 58 y.o. male contacted today regarding refills of specialty medication(s) Bictegravir-Emtricitab-Tenofov (Biktarvy )   Patient requested Delivery   Delivery date: 08/21/23   Verified address: [address] [place],  [place] [address]   Medication will be filled on 08/20/23.

## 2023-08-17 NOTE — Progress Notes (Signed)
 The ASCVD Risk score (Arnett DK, et al., 2019) failed to calculate for the following reasons:   Risk score cannot be calculated because patient has a medical history suggesting prior/existing ASCVD  Arlon Bergamo, BSN, RN

## 2023-08-31 ENCOUNTER — Ambulatory Visit: Admitting: Internal Medicine

## 2023-09-03 ENCOUNTER — Other Ambulatory Visit: Payer: Self-pay

## 2023-09-07 ENCOUNTER — Other Ambulatory Visit: Payer: Self-pay

## 2023-09-07 ENCOUNTER — Other Ambulatory Visit: Payer: Self-pay | Admitting: Internal Medicine

## 2023-09-10 ENCOUNTER — Other Ambulatory Visit: Payer: Self-pay

## 2023-09-10 NOTE — Telephone Encounter (Signed)
Appt 6/25

## 2023-09-12 ENCOUNTER — Ambulatory Visit: Admitting: Internal Medicine

## 2023-09-12 ENCOUNTER — Other Ambulatory Visit: Payer: Self-pay

## 2023-09-12 ENCOUNTER — Encounter: Payer: Self-pay | Admitting: Internal Medicine

## 2023-09-12 VITALS — BP 143/81 | HR 86 | Temp 98.2°F | Ht 64.0 in | Wt 214.0 lb

## 2023-09-12 DIAGNOSIS — B20 Human immunodeficiency virus [HIV] disease: Secondary | ICD-10-CM

## 2023-09-12 DIAGNOSIS — Z113 Encounter for screening for infections with a predominantly sexual mode of transmission: Secondary | ICD-10-CM

## 2023-09-12 MED ORDER — BICTEGRAVIR-EMTRICITAB-TENOFOV 50-200-25 MG PO TABS: 1.0000 | ORAL_TABLET | Freq: Every day | ORAL | 11 refills | Status: DC

## 2023-09-12 MED ORDER — BICTEGRAVIR-EMTRICITAB-TENOFOV 50-200-25 MG PO TABS
1.0000 | ORAL_TABLET | Freq: Every day | ORAL | 11 refills | Status: AC
  Filled 2023-09-12 – 2023-09-25 (×3): qty 30, 30d supply, fill #0
  Filled 2023-10-16 – 2023-10-26 (×3): qty 30, 30d supply, fill #1
  Filled 2023-11-16 – 2023-11-20 (×3): qty 30, 30d supply, fill #2
  Filled 2023-12-14 – 2023-12-25 (×2): qty 30, 30d supply, fill #3
  Filled 2024-01-18 – 2024-01-29 (×2): qty 30, 30d supply, fill #4
  Filled 2024-02-21 – 2024-02-29 (×2): qty 30, 30d supply, fill #5
  Filled 2024-03-26 – 2024-04-01 (×2): qty 30, 30d supply, fill #6
  Filled 2024-04-25: qty 30, 30d supply, fill #7

## 2023-09-12 MED ORDER — ATORVASTATIN CALCIUM 40 MG PO TABS
40.0000 mg | ORAL_TABLET | Freq: Every day | ORAL | 11 refills | Status: DC
  Filled 2023-09-12: qty 30, 30d supply, fill #0

## 2023-09-12 MED ORDER — ATORVASTATIN CALCIUM 40 MG PO TABS
40.0000 mg | ORAL_TABLET | Freq: Every day | ORAL | 11 refills | Status: AC
  Filled 2023-09-12 (×2): qty 30, 30d supply, fill #0

## 2023-09-12 NOTE — Progress Notes (Signed)
   Subjective:    Patient ID: Steven Collins, male    DOB: [birth year], 58 y.o.   MRN: [id number]  HPI Cort is here for follow up of HIV   09/12/23 initial visit with me Previously saw dr Efrain Well controlled on biktarvy    ROS: Constitutional: no fatigue      Objective:   Physical Exam  Eyes:     General: No scleral icterus.  Pulmonary:     Effort: Pulmonary effort is normal.   Neurological:     Mental Status: He is alert.    Labs: Lab Results  Component Value Date   WBC 11.6 (H) 12/13/2021   HGB 16.2 12/13/2021   HCT 46.8 12/13/2021   MCV 88.0 12/13/2021   PLT 272 12/13/2021   Last metabolic panel Lab Results  Component Value Date   GLUCOSE 154 (H) 08/16/2022   NA 131 (L) 08/16/2022   K 4.9 08/16/2022   CL 95 (L) 08/16/2022   CO2 26 08/16/2022   BUN 15 08/16/2022   CREATININE 0.81 08/16/2022   EGFR 102 08/16/2022   CALCIUM 9.6 08/16/2022   PROT 7.6 08/16/2022   ALBUMIN 3.6 07/22/2015   BILITOT 0.4 08/16/2022   ALKPHOS 78 07/22/2015   AST 25 08/16/2022   ALT 23 08/16/2022    HIV: Lab Results  Component Value Date   HIV1RNAQUANT <20 (H) 08/16/2022   Lab Results  Component Value Date   CD4TCELL 38 08/16/2022   CD4TABS 1,287 08/16/2022           Assessment & Plan:   #hiv #reprieve trial/cad risk Dx'ed 2007; heterosexual (GF had aids but didn't tell him) Cd4 nadir 37 @ 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 thrush in 2016 Therapy (started right at diagnose): 2016 Tivicay /truvada --> 2018 biktarvy  2007 Atripla until 2012 then off until 2016 (said he was well controlled and didn't know need to keep taking) Genotype 2018 R didanosine, abacavir, efavirenz, nevirapine (L74I, k103N, v108I, y181C, M184V, P225H); no PI resistance 2011 R efavirenz, nevirapine; no PI resistance  09/12/23 doing well no missed dose last 4 weeks   -start lipitor 40 -discussed u=u -encourage compliance -continue current HIV medication -labs today -f/u in 1  year   #soc hx -has 4 daughters and 1 son -lives with his fiancee Grayce Shams; patient's fiancee is negative; been together for 15 years -tobacco - 1ppd since I was a kid -etoh none -illicits none -programs machinary     #chronic pain Left shoulder [place] previously his hiv doc gave him pain meds Berneta) He asked for pain medication prescription and I referred him to his PCP for that.  Takes ibuprofen    #hcm -vaccination Tdap Meningococcal booster He wants to think about these -hep b S/p nonadjuvant hep b vaccine 2008 -std screening He wants to defer in monogamous relationship -tb screening 08/2023 -cancer screening Patient has a pcp [facility] [place]) Advise him to see pcp for age appropriate cancer screening (never had colonoscopy as of 08/2023) and also lung cancer

## 2023-09-12 NOTE — Patient Instructions (Addendum)
 Continue biktarvy    Start atorvastatin 40 mg once a day to reduce risk of heart attack/stroke   Make sure you see your regular doctor to screen for colon cancer, lung cancer, and prostate   Something to consider: Tetanus booster vaccine Meningococcal booster vaccine Let us  know if you want to have it done with us  next vist

## 2023-09-12 NOTE — Addendum Note (Signed)
 Addended byBETHA OVERTON FAITH T on: 09/12/2023 11:24 AM   Modules accepted: Orders

## 2023-09-13 LAB — T-HELPER CELLS (CD4) COUNT (NOT AT ARMC)
CD4 % Helper T Cell: 39 % (ref 33–65)
CD4 T Cell Abs: 1269 /uL (ref 400–1790)

## 2023-09-13 LAB — RPR: RPR Ser Ql: NONREACTIVE

## 2023-09-14 ENCOUNTER — Other Ambulatory Visit: Payer: Self-pay

## 2023-09-14 LAB — CBC
HCT: 45.3 % (ref 38.5–50.0)
Hemoglobin: 14.8 g/dL (ref 13.2–17.1)
MCH: 29 pg (ref 27.0–33.0)
MCHC: 32.7 g/dL (ref 32.0–36.0)
MCV: 88.6 fL (ref 80.0–100.0)
MPV: 9.3 fL (ref 7.5–12.5)
Platelets: 322 10*3/uL (ref 140–400)
RBC: 5.11 10*6/uL (ref 4.20–5.80)
RDW: 12.7 % (ref 11.0–15.0)
WBC: 8.6 10*3/uL (ref 3.8–10.8)

## 2023-09-14 LAB — COMPLETE METABOLIC PANEL WITHOUT GFR
AG Ratio: 1.1 (calc) (ref 1.0–2.5)
ALT: 20 U/L (ref 9–46)
AST: 22 U/L (ref 10–35)
Albumin: 4 g/dL (ref 3.6–5.1)
Alkaline phosphatase (APISO): 74 U/L (ref 35–144)
BUN: 10 mg/dL (ref 7–25)
CO2: 25 mmol/L (ref 20–32)
Calcium: 9.3 mg/dL (ref 8.6–10.3)
Chloride: 100 mmol/L (ref 98–110)
Creat: 0.86 mg/dL (ref 0.70–1.35)
Globulin: 3.5 g/dL (ref 1.9–3.7)
Glucose, Bld: 171 mg/dL — ABNORMAL HIGH (ref 65–99)
Potassium: 5 mmol/L (ref 3.5–5.3)
Sodium: 134 mmol/L — ABNORMAL LOW (ref 135–146)
Total Bilirubin: 0.2 mg/dL (ref 0.2–1.2)
Total Protein: 7.5 g/dL (ref 6.1–8.1)

## 2023-09-14 LAB — HIV-1 RNA QUANT-NO REFLEX-BLD
HIV 1 RNA Quant: NOT DETECTED {copies}/mL
HIV-1 RNA Quant, Log: NOT DETECTED {Log_copies}/mL

## 2023-09-25 ENCOUNTER — Other Ambulatory Visit: Payer: Self-pay

## 2023-09-25 ENCOUNTER — Other Ambulatory Visit: Payer: Self-pay | Admitting: Pharmacy Technician

## 2023-09-25 NOTE — Progress Notes (Signed)
 Specialty Pharmacy Refill Coordination Note  Steven Collins is a 58 y.o. male contacted today regarding refills of specialty medication(s) Bictegravir-Emtricitab-Tenofov (BIKTARVY )   Patient requested Delivery   Delivery date: 09/26/23   Verified address: [address]   [place] [address]   Medication will be filled on 09/25/23.  Spoke to patient's fiancee.

## 2023-10-16 ENCOUNTER — Other Ambulatory Visit: Payer: Self-pay

## 2023-10-18 ENCOUNTER — Other Ambulatory Visit: Payer: Self-pay

## 2023-10-22 ENCOUNTER — Other Ambulatory Visit: Payer: Self-pay

## 2023-10-24 ENCOUNTER — Other Ambulatory Visit: Payer: Self-pay

## 2023-10-26 ENCOUNTER — Other Ambulatory Visit: Payer: Self-pay

## 2023-10-26 NOTE — Progress Notes (Signed)
 Specialty Pharmacy Refill Coordination Note  CHALES PELISSIER is a 58 y.o. male contacted today regarding refills of specialty medication(s) Bictegravir-Emtricitab-Tenofov (BIKTARVY )   Patient requested Delivery   Delivery date: 10/29/23   Verified address: [address]   [place] [address]   Medication will be filled on 10/26/23.

## 2023-10-29 ENCOUNTER — Telehealth: Payer: Self-pay | Admitting: Internal Medicine

## 2023-10-29 NOTE — Telephone Encounter (Signed)
 I called Steven Collins to schedule a 1 year f/u with Dr. Overton, due around June 2026. No VM setup & no MyChart.

## 2023-11-16 ENCOUNTER — Other Ambulatory Visit: Payer: Self-pay

## 2023-11-20 ENCOUNTER — Other Ambulatory Visit: Payer: Self-pay

## 2023-11-22 ENCOUNTER — Other Ambulatory Visit: Payer: Self-pay

## 2023-11-22 NOTE — Progress Notes (Signed)
 Specialty Pharmacy Refill Coordination Note  Steven Collins is a 58 y.o. male contacted today regarding refills of specialty medication(s) Bictegravir-Emtricitab-Tenofov (BIKTARVY )   Patient requested Delivery   Delivery date: 11/26/23   Verified address: [address]   [place] [address]   Medication will be filled on 11/23/23.

## 2023-12-13 ENCOUNTER — Other Ambulatory Visit: Payer: Self-pay

## 2023-12-14 ENCOUNTER — Other Ambulatory Visit: Payer: Self-pay

## 2023-12-17 ENCOUNTER — Other Ambulatory Visit: Payer: Self-pay

## 2023-12-19 ENCOUNTER — Other Ambulatory Visit: Payer: Self-pay

## 2023-12-25 ENCOUNTER — Other Ambulatory Visit: Payer: Self-pay

## 2023-12-25 ENCOUNTER — Other Ambulatory Visit: Payer: Self-pay | Admitting: Pharmacy Technician

## 2023-12-25 NOTE — Progress Notes (Signed)
 Specialty Pharmacy Refill Coordination Note  Steven Collins is a 58 y.o. male contacted today regarding refills of specialty medication(s) Bictegravir-Emtricitab-Tenofov (BIKTARVY )   Patient requested Delivery   Delivery date: 12/27/23   Verified address: [address] [place]   Medication will be filled on 12/26/23.

## 2024-01-18 ENCOUNTER — Other Ambulatory Visit: Payer: Self-pay

## 2024-01-22 ENCOUNTER — Other Ambulatory Visit (HOSPITAL_COMMUNITY): Payer: Self-pay

## 2024-01-24 ENCOUNTER — Other Ambulatory Visit (HOSPITAL_COMMUNITY): Payer: Self-pay

## 2024-01-29 ENCOUNTER — Other Ambulatory Visit: Payer: Self-pay

## 2024-01-29 NOTE — Progress Notes (Signed)
 Specialty Pharmacy Refill Coordination Note  Steven Collins is a 58 y.o. male contacted today regarding refills of specialty medication(s) Bictegravir-Emtricitab-Tenofov (BIKTARVY )   Patient requested Delivery   Delivery date: 01/30/24   Verified address: [address] [place]   Medication will be filled on: 01/29/24

## 2024-01-29 NOTE — Progress Notes (Signed)
 Patient Satisfaction Survey Completed.

## 2024-02-01 ENCOUNTER — Other Ambulatory Visit: Payer: Self-pay

## 2024-02-21 ENCOUNTER — Other Ambulatory Visit (HOSPITAL_COMMUNITY): Payer: Self-pay

## 2024-02-25 ENCOUNTER — Other Ambulatory Visit (HOSPITAL_COMMUNITY): Payer: Self-pay

## 2024-02-27 ENCOUNTER — Other Ambulatory Visit: Payer: Self-pay

## 2024-02-29 ENCOUNTER — Other Ambulatory Visit: Payer: Self-pay

## 2024-02-29 NOTE — Progress Notes (Signed)
 Specialty Pharmacy Refill Coordination Note  Steven Collins is a 58 y.o. male contacted today regarding refills of specialty medication(s) Bictegravir-Emtricitab-Tenofov (BIKTARVY )   Patient requested Delivery   Delivery date: 03/04/24   Verified address: [address] [place]   Medication will be filled on: 03/03/24

## 2024-03-03 ENCOUNTER — Other Ambulatory Visit: Payer: Self-pay

## 2024-03-26 ENCOUNTER — Other Ambulatory Visit: Payer: Self-pay

## 2024-03-28 ENCOUNTER — Other Ambulatory Visit (HOSPITAL_COMMUNITY): Payer: Self-pay

## 2024-04-01 ENCOUNTER — Other Ambulatory Visit (HOSPITAL_COMMUNITY): Payer: Self-pay

## 2024-04-02 ENCOUNTER — Other Ambulatory Visit: Payer: Self-pay

## 2024-04-02 NOTE — Progress Notes (Signed)
 Specialty Pharmacy Refill Coordination Note  Steven Collins is a 59 y.o. male contacted today regarding refills of specialty medication(s) Bictegravir-Emtricitab-Tenofov (BIKTARVY )   Patient requested Delivery   Delivery date: 04/03/24   Verified address: [address] [place]   Medication will be filled on: 04/02/24

## 2024-04-25 ENCOUNTER — Other Ambulatory Visit: Payer: Self-pay
